# Patient Record
Sex: Male | Born: 1960
Health system: Southern US, Community
[De-identification: ages and names within clinical notes are randomized; demographics above are authoritative.]

## PROBLEM LIST (undated history)

## (undated) DIAGNOSIS — G43909 Migraine, unspecified, not intractable, without status migrainosus: Secondary | ICD-10-CM

## (undated) DIAGNOSIS — I10 Essential (primary) hypertension: Secondary | ICD-10-CM

## (undated) DIAGNOSIS — S83206A Unspecified tear of unspecified meniscus, current injury, right knee, initial encounter: Secondary | ICD-10-CM

## (undated) DIAGNOSIS — Z87438 Personal history of other diseases of male genital organs: Secondary | ICD-10-CM

## (undated) DIAGNOSIS — E669 Obesity, unspecified: Secondary | ICD-10-CM

## (undated) DIAGNOSIS — K635 Polyp of colon: Secondary | ICD-10-CM

## (undated) DIAGNOSIS — E785 Hyperlipidemia, unspecified: Secondary | ICD-10-CM

## (undated) DIAGNOSIS — J309 Allergic rhinitis, unspecified: Secondary | ICD-10-CM

## (undated) HISTORY — DX: Migraine, unspecified, not intractable, without status migrainosus: G43.909

## (undated) HISTORY — DX: Polyp of colon: K63.5

## (undated) HISTORY — DX: Personal history of other diseases of male genital organs: Z87.438

## (undated) HISTORY — DX: Hyperlipidemia, unspecified: E78.5

## (undated) HISTORY — DX: Unspecified tear of unspecified meniscus, current injury, right knee, initial encounter: S83.206A

## (undated) HISTORY — DX: Allergic rhinitis, unspecified: J30.9

## (undated) HISTORY — DX: Obesity, unspecified: E66.9

---

## 1999-02-19 ENCOUNTER — Emergency Department (HOSPITAL_COMMUNITY): Admission: EM | Admit: 1999-02-19 | Discharge: 1999-02-19 | Payer: Self-pay | Admitting: Emergency Medicine

## 1999-08-21 ENCOUNTER — Encounter: Payer: Self-pay | Admitting: *Deleted

## 1999-08-21 ENCOUNTER — Encounter: Admission: RE | Admit: 1999-08-21 | Discharge: 1999-08-21 | Payer: Self-pay | Admitting: *Deleted

## 2005-05-20 ENCOUNTER — Emergency Department (HOSPITAL_COMMUNITY): Admission: EM | Admit: 2005-05-20 | Discharge: 2005-05-20 | Payer: Self-pay | Admitting: Emergency Medicine

## 2006-01-11 ENCOUNTER — Encounter: Admission: RE | Admit: 2006-01-11 | Discharge: 2006-01-11 | Payer: Self-pay | Admitting: Neurology

## 2006-04-04 ENCOUNTER — Encounter: Admission: RE | Admit: 2006-04-04 | Discharge: 2006-04-04 | Payer: Self-pay | Admitting: Family Medicine

## 2011-12-01 ENCOUNTER — Other Ambulatory Visit (HOSPITAL_COMMUNITY)
Admission: RE | Admit: 2011-12-01 | Discharge: 2011-12-01 | Disposition: A | Discharge status: Home / Self Care | Payer: 59 | Source: Ambulatory Visit | Attending: Otolaryngology | Admitting: Otolaryngology

## 2011-12-01 DIAGNOSIS — R220 Localized swelling, mass and lump, head: Secondary | ICD-10-CM | POA: Insufficient documentation

## 2013-11-14 ENCOUNTER — Encounter: Payer: Self-pay | Admitting: *Deleted

## 2017-06-21 ENCOUNTER — Other Ambulatory Visit: Payer: Self-pay | Admitting: Otolaryngology

## 2017-06-21 DIAGNOSIS — R221 Localized swelling, mass and lump, neck: Principal | ICD-10-CM

## 2017-06-21 DIAGNOSIS — R220 Localized swelling, mass and lump, head: Secondary | ICD-10-CM

## 2017-07-05 ENCOUNTER — Ambulatory Visit
Admission: RE | Admit: 2017-07-05 | Discharge: 2017-07-05 | Disposition: A | Discharge status: Home / Self Care | Payer: 59 | Source: Ambulatory Visit | Attending: Otolaryngology | Admitting: Otolaryngology

## 2017-07-05 DIAGNOSIS — R221 Localized swelling, mass and lump, neck: Principal | ICD-10-CM

## 2017-07-05 DIAGNOSIS — R220 Localized swelling, mass and lump, head: Secondary | ICD-10-CM

## 2017-07-05 MED ORDER — IOPAMIDOL (ISOVUE-300) INJECTION 61%
75.0000 mL | Freq: Once | INTRAVENOUS | Status: AC | PRN
  Administered 2017-07-05: 75 mL via INTRAVENOUS

## 2017-12-09 ENCOUNTER — Other Ambulatory Visit (HOSPITAL_COMMUNITY): Payer: Self-pay | Admitting: Otolaryngology

## 2017-12-09 DIAGNOSIS — R221 Localized swelling, mass and lump, neck: Secondary | ICD-10-CM

## 2017-12-09 DIAGNOSIS — R220 Localized swelling, mass and lump, head: Secondary | ICD-10-CM

## 2017-12-15 ENCOUNTER — Ambulatory Visit (HOSPITAL_COMMUNITY)
Admission: RE | Admit: 2017-12-15 | Discharge: 2017-12-15 | Disposition: A | Discharge status: Home / Self Care | Payer: 59 | Source: Ambulatory Visit | Attending: Otolaryngology | Admitting: Otolaryngology

## 2017-12-15 ENCOUNTER — Encounter (HOSPITAL_COMMUNITY): Payer: Self-pay

## 2018-10-02 ENCOUNTER — Other Ambulatory Visit: Payer: Self-pay | Admitting: Internal Medicine

## 2018-10-02 ENCOUNTER — Ambulatory Visit
Admission: RE | Admit: 2018-10-02 | Discharge: 2018-10-02 | Disposition: A | Discharge status: Home / Self Care | Payer: 59 | Source: Ambulatory Visit | Attending: Internal Medicine | Admitting: Internal Medicine

## 2018-10-02 ENCOUNTER — Other Ambulatory Visit: Payer: Self-pay

## 2018-10-02 DIAGNOSIS — R0789 Other chest pain: Secondary | ICD-10-CM

## 2019-07-16 ENCOUNTER — Ambulatory Visit: Payer: 59 | Attending: Internal Medicine

## 2019-07-16 DIAGNOSIS — Z20822 Contact with and (suspected) exposure to covid-19: Secondary | ICD-10-CM

## 2019-07-17 LAB — NOVEL CORONAVIRUS, NAA: SARS-CoV-2, NAA: DETECTED — AB

## 2019-07-17 LAB — SARS-COV-2, NAA 2 DAY TAT

## 2019-07-19 ENCOUNTER — Other Ambulatory Visit (HOSPITAL_COMMUNITY): Payer: Self-pay | Admitting: Nurse Practitioner

## 2019-07-19 ENCOUNTER — Encounter: Payer: Self-pay | Admitting: Nurse Practitioner

## 2019-07-19 ENCOUNTER — Telehealth (HOSPITAL_COMMUNITY): Payer: Self-pay | Admitting: Nurse Practitioner

## 2019-07-19 DIAGNOSIS — U071 COVID-19: Secondary | ICD-10-CM

## 2019-07-19 NOTE — Progress Notes (Signed)
I connected by phone with Luis Cameron on 07/19/2019 at 1:12 PM to discuss the potential use of an new treatment for mild to moderate COVID-19 viral infection in non-hospitalized patients.  This patient is a 59 y.o. male that meets the FDA criteria for Emergency Use Authorization of bamlanivimab or casirivimab\imdevimab.  Has a (+) direct SARS-CoV-2 viral test result  Has mild or moderate COVID-19   Is ? 58 years of age and weighs ? 40 kg  Is NOT hospitalized due to COVID-19  Is NOT requiring oxygen therapy or requiring an increase in baseline oxygen flow rate due to COVID-19  Is within 10 days of symptom onset  Has at least one of the high risk factor(s) for progression to severe COVID-19 and/or hospitalization as defined in EUA.  Specific high risk criteria : Hypertension  I have spoken and communicated the following to the patient or parent/caregiver:  1. FDA has authorized the emergency use of bamlanivimab and casirivimab\imdevimab for the treatment of mild to moderate COVID-19 in adults and pediatric patients with positive results of direct SARS-CoV-2 viral testing who are 34 years of age and older weighing at least 40 kg, and who are at high risk for progressing to severe COVID-19 and/or hospitalization.  2. The significant known and potential risks and benefits of bamlanivimab and casirivimab\imdevimab, and the extent to which such potential risks and benefits are unknown.  3. Information on available alternative treatments and the risks and benefits of those alternatives, including clinical trials.  4. Patients treated with bamlanivimab and casirivimab\imdevimab should continue to self-isolate and use infection control measures (e.g., wear mask, isolate, social distance, avoid sharing personal items, clean and disinfect "high touch" surfaces, and frequent handwashing) according to CDC guidelines.   5. The patient or parent/caregiver has the option to accept or refuse  bamlanivimab or casirivimab\imdevimab .  After reviewing this information with the patient, The patient agreed to proceed with receiving the bamlanimivab infusion and will be provided a copy of the Fact sheet prior to receiving the infusion.Beckey Rutter, [place], AGNP-C [phone] ([place])

## 2019-07-19 NOTE — Telephone Encounter (Signed)
Called to Discuss with patient about Covid symptoms and the use of bamlanivimab, a monoclonal antibody infusion for those with mild to moderate Covid symptoms and at a high risk of hospitalization.     Pt is qualified for this infusion at the [facility] infusion center due to co-morbid conditions and/or a member of an at-risk group.     Symptoms started on 3/23. Patient diagnosed with hypertension and > 58 years of age. See orders encounter for details regarding MAB treatment.   Beckey Rutter, [place], AGNP-C [phone] ([place])

## 2019-07-20 ENCOUNTER — Ambulatory Visit (HOSPITAL_COMMUNITY)
Admission: RE | Admit: 2019-07-20 | Discharge: 2019-07-20 | Disposition: A | Discharge status: Home / Self Care | Payer: 59 | Source: Ambulatory Visit | Attending: Pulmonary Disease | Admitting: Pulmonary Disease

## 2019-07-20 DIAGNOSIS — U071 COVID-19: Secondary | ICD-10-CM | POA: Insufficient documentation

## 2019-07-20 MED ORDER — EPINEPHRINE 0.3 MG/0.3ML IJ SOAJ: 0.3000 mg | Freq: Once | INTRAMUSCULAR | Status: DC | PRN

## 2019-07-20 MED ORDER — SODIUM CHLORIDE 0.9 % IV SOLN
700.0000 mg | Freq: Once | INTRAVENOUS | Status: AC
  Administered 2019-07-20: 13:00:00 700 mg via INTRAVENOUS
  Filled 2019-07-20: qty 700

## 2019-07-20 MED ORDER — DIPHENHYDRAMINE HCL 50 MG/ML IJ SOLN: 50.0000 mg | Freq: Once | INTRAMUSCULAR | Status: DC | PRN

## 2019-07-20 MED ORDER — ALBUTEROL SULFATE HFA 108 (90 BASE) MCG/ACT IN AERS: 2.0000 | INHALATION_SPRAY | Freq: Once | RESPIRATORY_TRACT | Status: DC | PRN

## 2019-07-20 MED ORDER — SODIUM CHLORIDE 0.9 % IV SOLN: INTRAVENOUS | Status: DC | PRN

## 2019-07-20 MED ORDER — METHYLPREDNISOLONE SODIUM SUCC 125 MG IJ SOLR: 125.0000 mg | Freq: Once | INTRAMUSCULAR | Status: DC | PRN

## 2019-07-20 MED ORDER — FAMOTIDINE IN NACL 20-0.9 MG/50ML-% IV SOLN: 20.0000 mg | Freq: Once | INTRAVENOUS | Status: DC | PRN

## 2019-07-20 NOTE — Progress Notes (Signed)
  Diagnosis: COVID-19  Physician:dr wright   Procedure: [facility] Med: bamlanivimab infusion - Provided patient with bamlanimivab fact sheet for patients, parents and caregivers prior to infusion.  Complications: No immediate complications noted.  Discharge: Discharged home   Woodrow Drab R 07/20/2019   

## 2019-07-20 NOTE — Discharge Instructions (Signed)
COVID-19 COVID-19 is a respiratory infection that is caused by a virus called severe acute respiratory syndrome coronavirus 2 (SARS-CoV-2). The disease is also known as coronavirus disease or novel coronavirus. In some people, the virus may not cause any symptoms. In others, it may cause a serious infection. The infection can get worse quickly and can lead to complications, such as:  Pneumonia, or infection of the lungs.  Acute respiratory distress syndrome or ARDS. This is a condition in which fluid build-up in the lungs prevents the lungs from filling with air and passing oxygen into the blood.  Acute respiratory failure. This is a condition in which there is not enough oxygen passing from the lungs to the body or when carbon dioxide is not passing from the lungs out of the body.  Sepsis or septic shock. This is a serious bodily reaction to an infection.  Blood clotting problems.  Secondary infections due to bacteria or fungus.  Organ failure. This is when your body's organs stop working. The virus that causes COVID-19 is contagious. This means that it can spread from person to person through droplets from coughs and sneezes (respiratory secretions). What are the causes? This illness is caused by a virus. You may catch the virus by:  Breathing in droplets from an infected person. Droplets can be spread by a person breathing, speaking, singing, coughing, or sneezing.  Touching something, like a table or a doorknob, that was exposed to the virus (contaminated) and then touching your mouth, nose, or eyes. What increases the risk? Risk for infection You are more likely to be infected with this virus if you:  Are within 6 feet (2 meters) of a person with COVID-19.  Provide care for or live with a person who is infected with COVID-19.  Spend time in crowded indoor spaces or live in shared housing. Risk for serious illness You are more likely to become seriously ill from the virus if  you:  Are 50 years of age or older. The higher your age, the more you are at risk for serious illness.  Live in a nursing home or long-term care facility.  Have cancer.  Have a long-term (chronic) disease such as: ? Chronic lung disease, including chronic obstructive pulmonary disease or asthma. ? A long-term disease that lowers your body's ability to fight infection (immunocompromised). ? Heart disease, including heart failure, a condition in which the arteries that lead to the heart become narrow or blocked (coronary artery disease), a disease which makes the heart muscle thick, weak, or stiff (cardiomyopathy). ? Diabetes. ? Chronic kidney disease. ? Sickle cell disease, a condition in which red blood cells have an abnormal "sickle" shape. ? Liver disease.  Are obese. What are the signs or symptoms? Symptoms of this condition can range from mild to severe. Symptoms may appear any time from 2 to 14 days after being exposed to the virus. They include:  A fever or chills.  A cough.  Difficulty breathing.  Headaches, body aches, or muscle aches.  Runny or stuffy (congested) nose.  A sore throat.  New loss of taste or smell. Some people may also have stomach problems, such as nausea, vomiting, or diarrhea. Other people may not have any symptoms of COVID-19. How is this diagnosed? This condition may be diagnosed based on:  Your signs and symptoms, especially if: ? You live in an area with a COVID-19 outbreak. ? You recently traveled to or from an area where the virus is common. ? You   provide care for or live with a person who was diagnosed with COVID-19. ? You were exposed to a person who was diagnosed with COVID-19.  A physical exam.  Lab tests, which may include: ? Taking a sample of fluid from the back of your nose and throat (nasopharyngeal fluid), your nose, or your throat using a swab. ? A sample of mucus from your lungs (sputum). ? Blood tests.  Imaging tests,  which may include, X-rays, CT scan, or ultrasound. How is this treated? At present, there is no medicine to treat COVID-19. Medicines that treat other diseases are being used on a trial basis to see if they are effective against COVID-19. Your health care provider will talk with you about ways to treat your symptoms. For most people, the infection is mild and can be managed at home with rest, fluids, and over-the-counter medicines. Treatment for a serious infection usually takes places in a hospital intensive care unit (ICU). It may include one or more of the following treatments. These treatments are given until your symptoms improve.  Receiving fluids and medicines through an IV.  Supplemental oxygen. Extra oxygen is given through a tube in the nose, a face mask, or a hood.  Positioning you to lie on your stomach (prone position). This makes it easier for oxygen to get into the lungs.  Continuous positive airway pressure (CPAP) or bi-level positive airway pressure (BPAP) machine. This treatment uses mild air pressure to keep the airways open. A tube that is connected to a motor delivers oxygen to the body.  Ventilator. This treatment moves air into and out of the lungs by using a tube that is placed in your windpipe.  Tracheostomy. This is a procedure to create a hole in the neck so that a breathing tube can be inserted.  Extracorporeal membrane oxygenation (ECMO). This procedure gives the lungs a chance to recover by taking over the functions of the heart and lungs. It supplies oxygen to the body and removes carbon dioxide. Follow these instructions at home: Lifestyle  If you are sick, stay home except to get medical care. Your health care provider will tell you how long to stay home. Call your health care provider before you go for medical care.  Rest at home as told by your health care provider.  Do not use any products that contain nicotine or tobacco, such as cigarettes,  e-cigarettes, and chewing tobacco. If you need help quitting, ask your health care provider.  Return to your normal activities as told by your health care provider. Ask your health care provider what activities are safe for you. General instructions  Take over-the-counter and prescription medicines only as told by your health care provider.  Drink enough fluid to keep your urine pale yellow.  Keep all follow-up visits as told by your health care provider. This is important. How is this prevented?  There is no vaccine to help prevent COVID-19 infection. However, there are steps you can take to protect yourself and others from this virus. To protect yourself:   Do not travel to areas where COVID-19 is a risk. The areas where COVID-19 is reported change often. To identify high-risk areas and travel restrictions, check the CDC travel website: wwwnc.cdc.gov/travel/notices  If you live in, or must travel to, an area where COVID-19 is a risk, take precautions to avoid infection. ? Stay away from people who are sick. ? Wash your hands often with soap and water for 20 seconds. If soap and water   are not available, use an alcohol-based hand sanitizer. ? Avoid touching your mouth, face, eyes, or nose. ? Avoid going out in public, follow guidance from your state and local health authorities. ? If you must go out in public, wear a cloth face covering or face mask. Make sure your mask covers your nose and mouth. ? Avoid crowded indoor spaces. Stay at least 6 feet (2 meters) away from others. ? Disinfect objects and surfaces that are frequently touched every day. This may include:  Counters and tables.  Doorknobs and light switches.  Sinks and faucets.  Electronics, such as phones, remote controls, keyboards, computers, and tablets. To protect others: If you have symptoms of COVID-19, take steps to prevent the virus from spreading to others.  If you think you have a COVID-19 infection, contact  your health care provider right away. Tell your health care team that you think you may have a COVID-19 infection.  Stay home. Leave your house only to seek medical care. Do not use public transport.  Do not travel while you are sick.  Wash your hands often with soap and water for 20 seconds. If soap and water are not available, use alcohol-based hand sanitizer.  Stay away from other members of your household. Let healthy household members care for children and pets, if possible. If you have to care for children or pets, wash your hands often and wear a mask. If possible, stay in your own room, separate from others. Use a different bathroom.  Make sure that all people in your household wash their hands well and often.  Cough or sneeze into a tissue or your sleeve or elbow. Do not cough or sneeze into your hand or into the air.  Wear a cloth face covering or face mask. Make sure your mask covers your nose and mouth. Where to find more information  Centers for Disease Control and Prevention: www.cdc.gov/coronavirus/2019-ncov/index.html  World Health Organization: www.who.int/health-topics/coronavirus Contact a health care provider if:  You live in or have traveled to an area where COVID-19 is a risk and you have symptoms of the infection.  You have had contact with someone who has COVID-19 and you have symptoms of the infection. Get help right away if:  You have trouble breathing.  You have pain or pressure in your chest.  You have confusion.  You have bluish lips and fingernails.  You have difficulty waking from sleep.  You have symptoms that get worse. These symptoms may represent a serious problem that is an emergency. Do not wait to see if the symptoms will go away. Get medical help right away. Call your local emergency services (911 in the [place] Do not drive yourself to the hospital. Let the emergency medical personnel know if you think you have  COVID-19. Summary  COVID-19 is a respiratory infection that is caused by a virus. It is also known as coronavirus disease or novel coronavirus. It can cause serious infections, such as pneumonia, acute respiratory distress syndrome, acute respiratory failure, or sepsis.  The virus that causes COVID-19 is contagious. This means that it can spread from person to person through droplets from breathing, speaking, singing, coughing, or sneezing.  You are more likely to develop a serious illness if you are 50 years of age or older, have a weak immune system, live in a nursing home, or have chronic disease.  There is no medicine to treat COVID-19. Your health care provider will talk with you about ways to treat your symptoms.    Take steps to protect yourself and others from infection. Wash your hands often and disinfect objects and surfaces that are frequently touched every day. Stay away from people who are sick and wear a mask if you are sick. This information is not intended to replace advice given to you by your health care provider. Make sure you discuss any questions you have with your health care provider. Document Revised: 02/09/2019 Document Reviewed: 05/18/2018 Elsevier Patient Education  2020 [employer] Inc. What types of side effects do monoclonal antibody drugs cause?  Common side effects  In general, the more common side effects caused by monoclonal antibody drugs include: . Allergic reactions, such as hives or itching . Flu-like signs and symptoms, including chills, fatigue, fever, and muscle aches and pains . Nausea, vomiting . Diarrhea . Skin rashes . Low blood pressure   The CDC is recommending patients who receive monoclonal antibody treatments wait at least 90 days before being vaccinated.  Currently, there are no data on the safety and efficacy of mRNA COVID-19 vaccines in persons who received monoclonal antibodies or convalescent plasma as part of COVID-19 treatment. Based  on the estimated half-life of such therapies as well as evidence suggesting that reinfection is uncommon in the 90 days after initial infection, vaccination should be deferred for at least 90 days, as a precautionary measure until additional information becomes available, to avoid interference of the antibody treatment with vaccine-induced immune responses. 

## 2020-02-28 ENCOUNTER — Other Ambulatory Visit: Payer: Self-pay | Admitting: Otolaryngology

## 2020-02-28 DIAGNOSIS — D110 Benign neoplasm of parotid gland: Secondary | ICD-10-CM

## 2020-03-03 ENCOUNTER — Other Ambulatory Visit: Payer: Self-pay

## 2020-03-03 ENCOUNTER — Ambulatory Visit
Admission: RE | Admit: 2020-03-03 | Discharge: 2020-03-03 | Disposition: A | Discharge status: Home / Self Care | Payer: 59 | Source: Ambulatory Visit | Attending: Otolaryngology | Admitting: Otolaryngology

## 2020-03-03 DIAGNOSIS — D110 Benign neoplasm of parotid gland: Secondary | ICD-10-CM

## 2020-03-03 MED ORDER — IOPAMIDOL (ISOVUE-300) INJECTION 61%
75.0000 mL | Freq: Once | INTRAVENOUS | Status: AC | PRN
  Administered 2020-03-03: 75 mL via INTRAVENOUS

## 2020-12-22 ENCOUNTER — Other Ambulatory Visit: Payer: Self-pay | Admitting: Internal Medicine

## 2020-12-22 DIAGNOSIS — E785 Hyperlipidemia, unspecified: Secondary | ICD-10-CM

## 2021-01-13 ENCOUNTER — Ambulatory Visit
Admission: RE | Admit: 2021-01-13 | Discharge: 2021-01-13 | Disposition: A | Discharge status: Home / Self Care | Payer: No Typology Code available for payment source | Source: Ambulatory Visit | Attending: Internal Medicine | Admitting: Internal Medicine

## 2021-01-13 DIAGNOSIS — E785 Hyperlipidemia, unspecified: Secondary | ICD-10-CM

## 2021-07-16 DIAGNOSIS — H00022 Hordeolum internum right lower eyelid: Secondary | ICD-10-CM | POA: Diagnosis not present

## 2021-12-24 DIAGNOSIS — Z0001 Encounter for general adult medical examination with abnormal findings: Secondary | ICD-10-CM | POA: Diagnosis not present

## 2021-12-24 DIAGNOSIS — K219 Gastro-esophageal reflux disease without esophagitis: Secondary | ICD-10-CM | POA: Diagnosis not present

## 2021-12-24 DIAGNOSIS — I10 Essential (primary) hypertension: Secondary | ICD-10-CM | POA: Diagnosis not present

## 2021-12-24 DIAGNOSIS — R3129 Other microscopic hematuria: Secondary | ICD-10-CM | POA: Diagnosis not present

## 2021-12-24 DIAGNOSIS — E785 Hyperlipidemia, unspecified: Secondary | ICD-10-CM | POA: Diagnosis not present

## 2021-12-25 DIAGNOSIS — Z125 Encounter for screening for malignant neoplasm of prostate: Secondary | ICD-10-CM | POA: Diagnosis not present

## 2021-12-25 DIAGNOSIS — I10 Essential (primary) hypertension: Secondary | ICD-10-CM | POA: Diagnosis not present

## 2021-12-25 DIAGNOSIS — E559 Vitamin D deficiency, unspecified: Secondary | ICD-10-CM | POA: Diagnosis not present

## 2021-12-25 DIAGNOSIS — E785 Hyperlipidemia, unspecified: Secondary | ICD-10-CM | POA: Diagnosis not present

## 2022-04-05 IMAGING — CT CT CARDIAC CORONARY ARTERY CALCIUM SCORE
3 series · 14 of 20 positions shown, 16 images · non-contrast
Comparison: None.

CLINICAL DATA: Hyperlipidemia

EXAM:
CT CARDIAC CORONARY ARTERY CALCIUM SCORE
TECHNIQUE: Non-contrast imaging through the heart was performed using
prospective ECG gating. Image post processing was performed on an
independent workstation, allowing for quantitative analysis of the
heart and coronary arteries. Note that this exam targets the heart
and the chest was not imaged in its entirety.

[Series 2: calcium scoring 2.00 qr36 bestdiast 71% hrt calciu · axial · 0.45mm/px · z∈[+1702,+1786]mm · 4 of 70 slices shown]
[im 14/70  vessel]
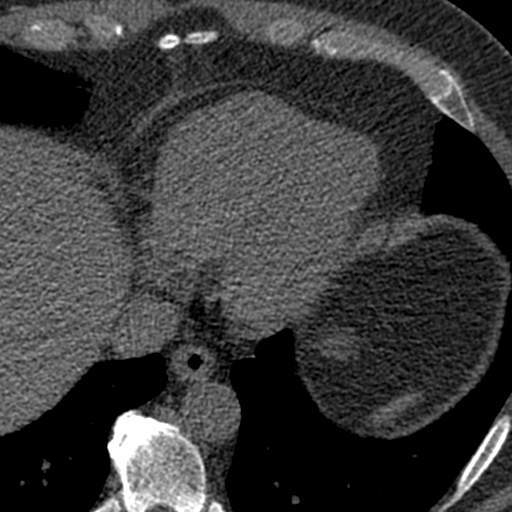
[im 28/70  vessel]
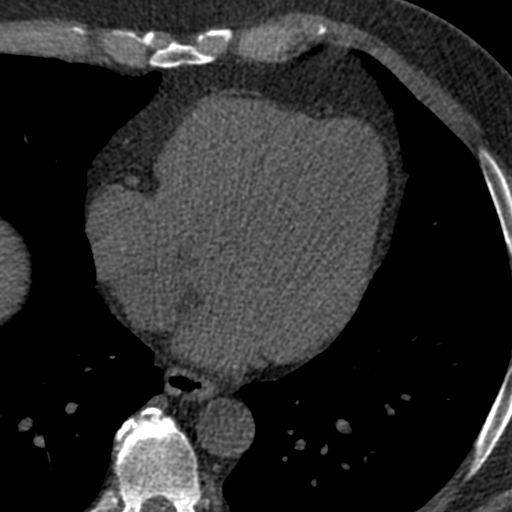
[im 42/70  vessel]
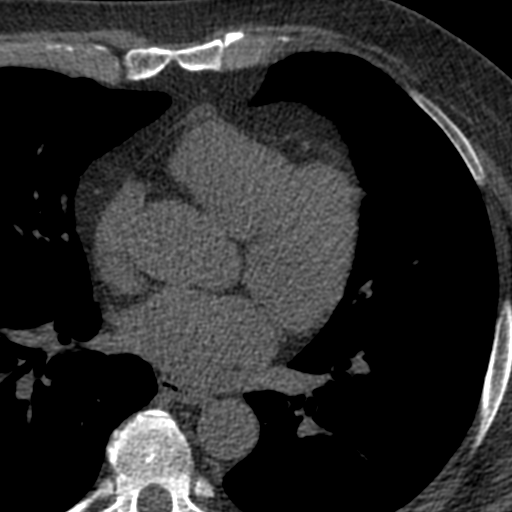
[im 56/70  vessel]
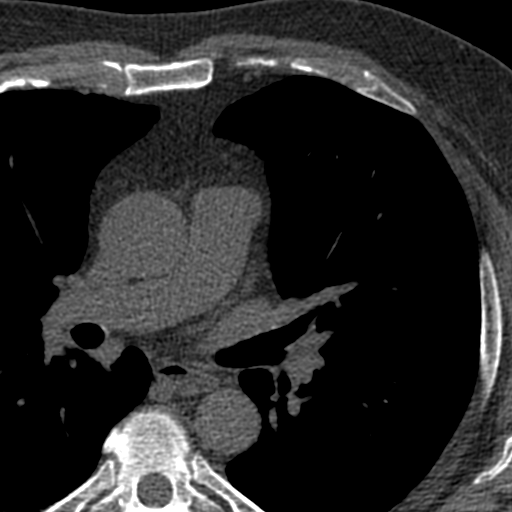

[Series 3: calcium scoring 2.00 br40 bestdiast 71% axial · axial · 0.63mm/px · z∈[+1698,+1790]mm · 5 of 70 slices shown, 7 images]
[im 12/70  vessel]
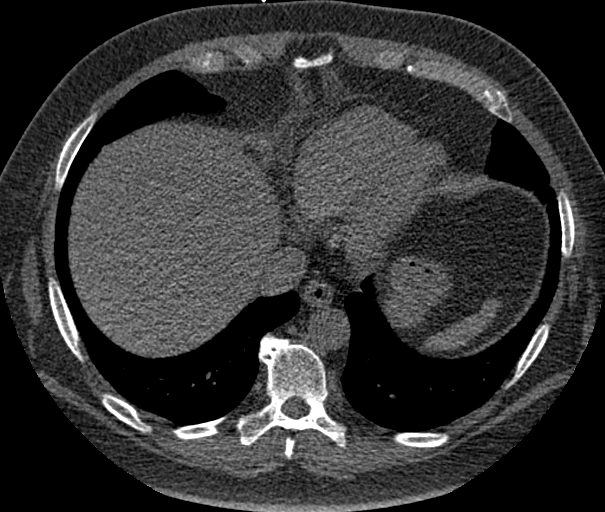
[im 12/70  lung]
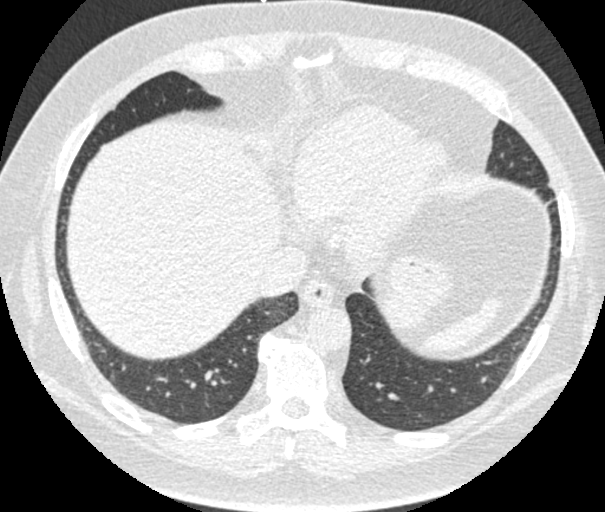
[im 24/70  vessel]
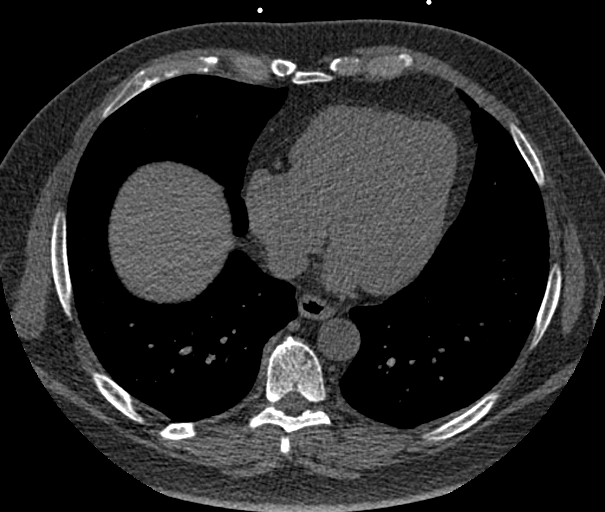
[im 35/70  vessel]
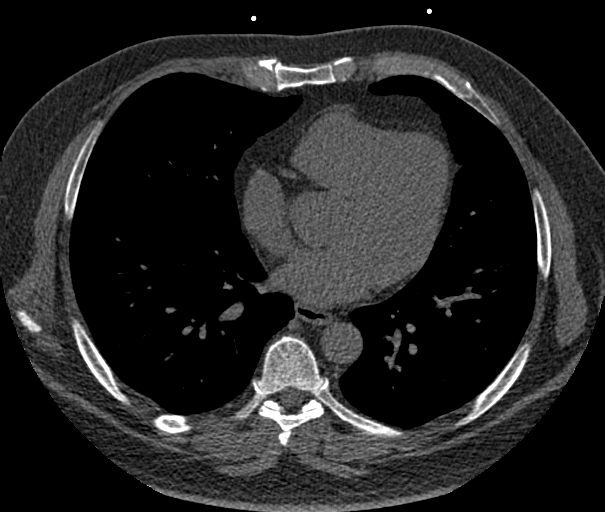
[im 47/70  vessel]
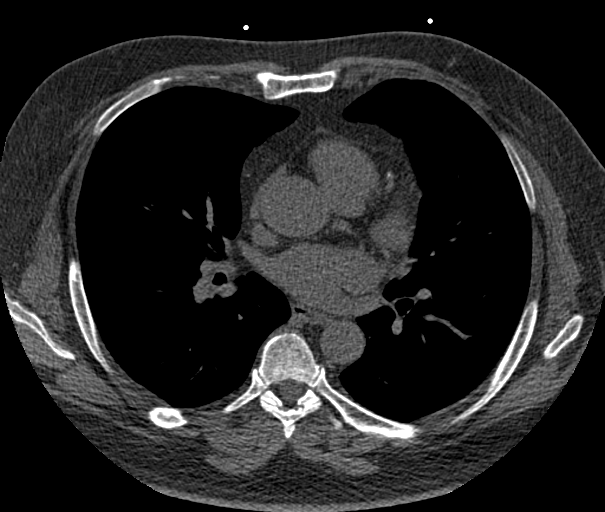
[im 58/70  vessel]
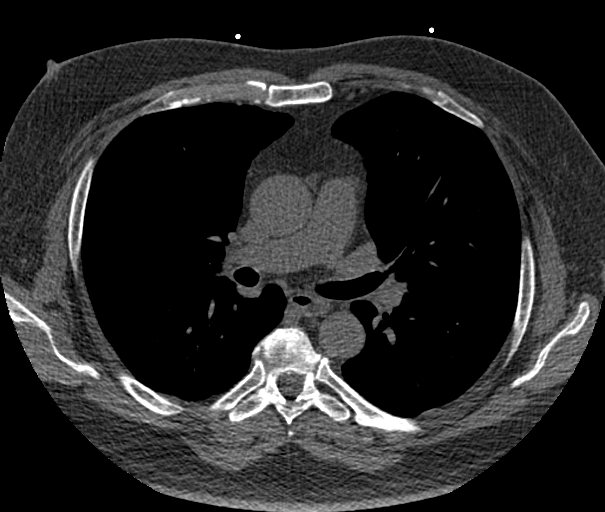
[im 58/70  lung]
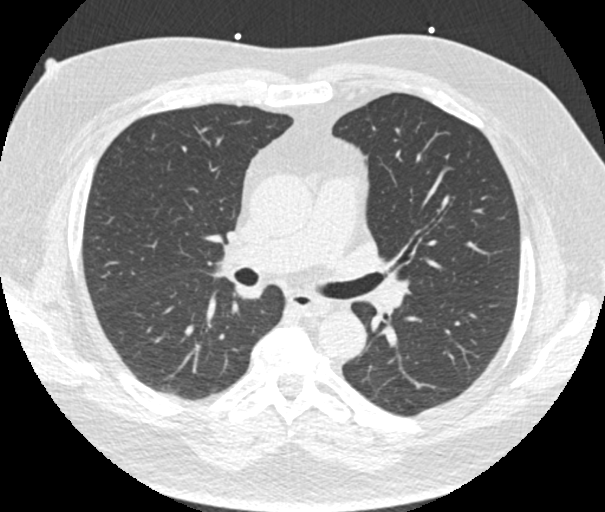

[Series 9: calcium scoring 2.00 br60 bestdiast 71% lungs · axial · 0.66mm/px · z∈[+1698,+1790]mm · 5 of 70 slices shown]
[im 12/70  vessel]
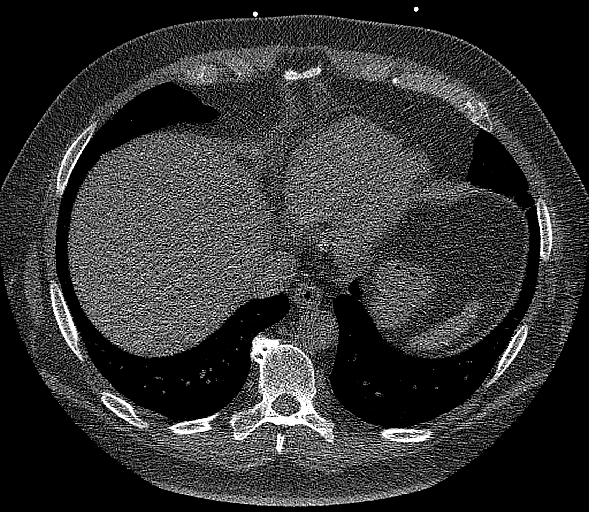
[im 24/70  vessel]
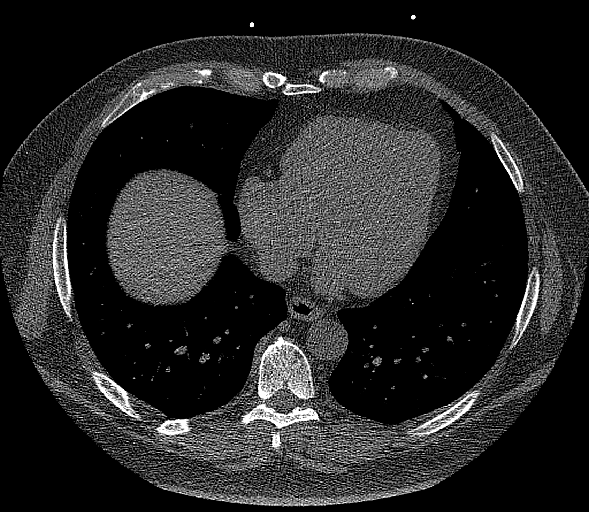
[im 35/70  vessel]
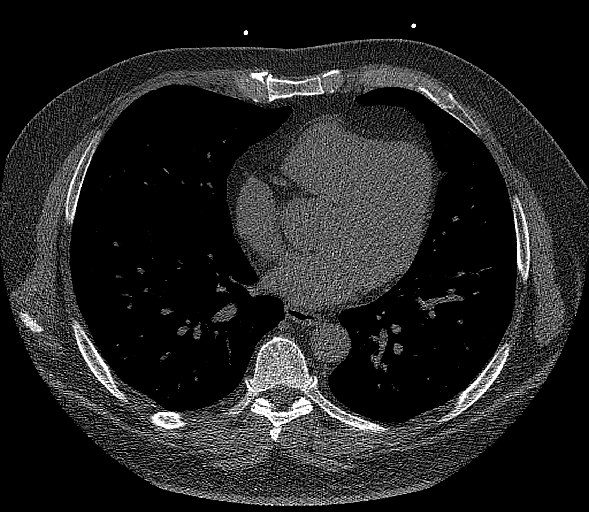
[im 47/70  vessel]
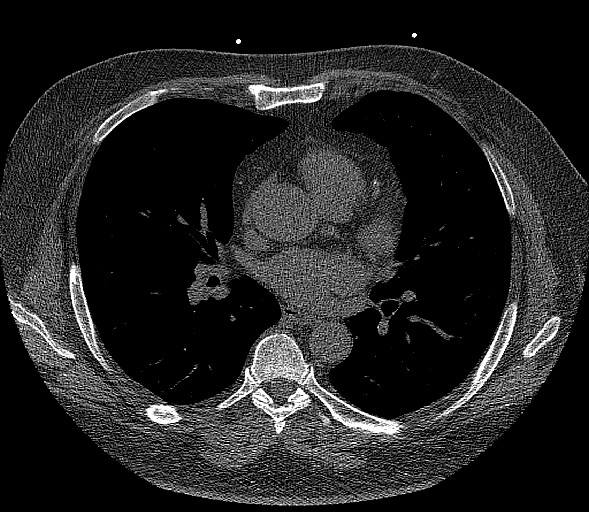
[im 58/70  vessel]
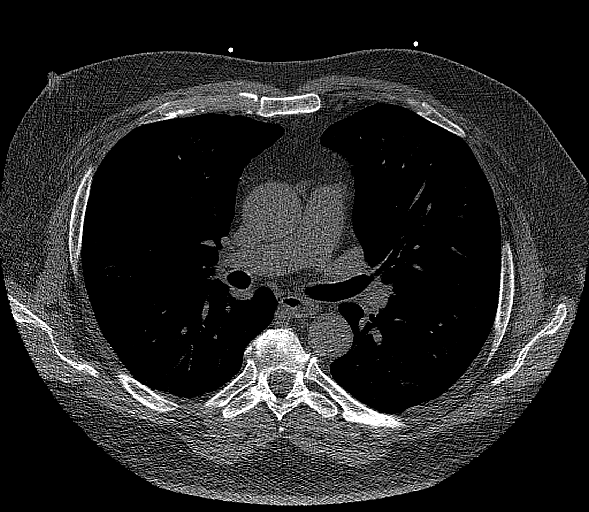

[14 of 20 positions shown; findings below may reference images not displayed]

FINDINGS: CORONARY CALCIUM SCORES:

Left Main: 0

LAD: 39

LCx: 0

RCA: 20

Total Agatston Score: 59

[HOSPITAL] percentile: 62

AORTA MEASUREMENTS:

Ascending Aorta: 35 mm

Descending Aorta: 27 mm

OTHER FINDINGS:

Heart is normal size. Aorta normal caliber. Scattered calcifications
in the aortic arch. No adenopathy. No confluent opacity or effusion.
Imaging into the upper abdomen demonstrates no acute findings. Chest
wall soft tissues are unremarkable. No acute bony abnormality.
IMPRESSION: Total Agatston score: 59

[HOSPITAL] percentile: 62

Aortic atherosclerosis.

No acute extra cardiac abnormality.

## 2022-05-27 DIAGNOSIS — D485 Neoplasm of uncertain behavior of skin: Secondary | ICD-10-CM | POA: Diagnosis not present

## 2022-07-15 DIAGNOSIS — D110 Benign neoplasm of parotid gland: Secondary | ICD-10-CM | POA: Diagnosis not present

## 2022-09-23 DIAGNOSIS — L738 Other specified follicular disorders: Secondary | ICD-10-CM | POA: Diagnosis not present

## 2022-09-23 DIAGNOSIS — D224 Melanocytic nevi of scalp and neck: Secondary | ICD-10-CM | POA: Diagnosis not present

## 2022-09-23 DIAGNOSIS — D225 Melanocytic nevi of trunk: Secondary | ICD-10-CM | POA: Diagnosis not present

## 2022-12-10 DIAGNOSIS — H1013 Acute atopic conjunctivitis, bilateral: Secondary | ICD-10-CM | POA: Diagnosis not present

## 2023-01-13 DIAGNOSIS — Z23 Encounter for immunization: Secondary | ICD-10-CM | POA: Diagnosis not present

## 2023-01-13 DIAGNOSIS — E7800 Pure hypercholesterolemia, unspecified: Secondary | ICD-10-CM | POA: Diagnosis not present

## 2023-01-13 DIAGNOSIS — I10 Essential (primary) hypertension: Secondary | ICD-10-CM | POA: Diagnosis not present

## 2023-01-13 DIAGNOSIS — R739 Hyperglycemia, unspecified: Secondary | ICD-10-CM | POA: Diagnosis not present

## 2023-01-13 DIAGNOSIS — Z0000 Encounter for general adult medical examination without abnormal findings: Secondary | ICD-10-CM | POA: Diagnosis not present

## 2023-01-13 DIAGNOSIS — Z125 Encounter for screening for malignant neoplasm of prostate: Secondary | ICD-10-CM | POA: Diagnosis not present

## 2023-01-28 DIAGNOSIS — I10 Essential (primary) hypertension: Secondary | ICD-10-CM | POA: Diagnosis not present

## 2023-01-28 DIAGNOSIS — R7309 Other abnormal glucose: Secondary | ICD-10-CM | POA: Diagnosis not present

## 2023-10-21 DIAGNOSIS — N201 Calculus of ureter: Secondary | ICD-10-CM | POA: Diagnosis not present

## 2023-10-21 DIAGNOSIS — R3911 Hesitancy of micturition: Secondary | ICD-10-CM | POA: Diagnosis not present

## 2024-04-14 ENCOUNTER — Encounter (HOSPITAL_COMMUNITY): Payer: Self-pay | Admitting: Internal Medicine

## 2024-04-14 ENCOUNTER — Inpatient Hospital Stay (HOSPITAL_COMMUNITY)
Admission: EM | Admit: 2024-04-14 | Discharge: 2024-04-17 | DRG: 440 | Disposition: A | Discharge status: Home / Self Care | Source: Ambulatory Visit | Attending: Internal Medicine | Admitting: Internal Medicine

## 2024-04-14 ENCOUNTER — Emergency Department (HOSPITAL_COMMUNITY)

## 2024-04-14 ENCOUNTER — Other Ambulatory Visit: Payer: Self-pay

## 2024-04-14 DIAGNOSIS — Z823 Family history of stroke: Secondary | ICD-10-CM | POA: Diagnosis not present

## 2024-04-14 DIAGNOSIS — Z6833 Body mass index (BMI) 33.0-33.9, adult: Secondary | ICD-10-CM | POA: Diagnosis not present

## 2024-04-14 DIAGNOSIS — Z7982 Long term (current) use of aspirin: Secondary | ICD-10-CM

## 2024-04-14 DIAGNOSIS — E785 Hyperlipidemia, unspecified: Secondary | ICD-10-CM | POA: Diagnosis present

## 2024-04-14 DIAGNOSIS — E669 Obesity, unspecified: Secondary | ICD-10-CM | POA: Diagnosis present

## 2024-04-14 DIAGNOSIS — Z8249 Family history of ischemic heart disease and other diseases of the circulatory system: Secondary | ICD-10-CM | POA: Diagnosis not present

## 2024-04-14 DIAGNOSIS — K859 Acute pancreatitis without necrosis or infection, unspecified: Secondary | ICD-10-CM | POA: Diagnosis present

## 2024-04-14 DIAGNOSIS — Z91018 Allergy to other foods: Secondary | ICD-10-CM

## 2024-04-14 DIAGNOSIS — F1090 Alcohol use, unspecified, uncomplicated: Secondary | ICD-10-CM | POA: Diagnosis present

## 2024-04-14 DIAGNOSIS — Z860100 Personal history of colon polyps, unspecified: Secondary | ICD-10-CM | POA: Diagnosis not present

## 2024-04-14 DIAGNOSIS — K852 Alcohol induced acute pancreatitis without necrosis or infection: Secondary | ICD-10-CM

## 2024-04-14 DIAGNOSIS — Z888 Allergy status to other drugs, medicaments and biological substances status: Secondary | ICD-10-CM | POA: Diagnosis not present

## 2024-04-14 DIAGNOSIS — Z79899 Other long term (current) drug therapy: Secondary | ICD-10-CM

## 2024-04-14 DIAGNOSIS — I10 Essential (primary) hypertension: Secondary | ICD-10-CM | POA: Diagnosis present

## 2024-04-14 HISTORY — DX: Essential (primary) hypertension: I10

## 2024-04-14 LAB — COMPREHENSIVE METABOLIC PANEL WITH GFR
ALT: 22 U/L (ref 0–44)
AST: 24 U/L (ref 15–41)
Albumin: 4.2 g/dL (ref 3.5–5.0)
Alkaline Phosphatase: 96 U/L (ref 38–126)
Anion gap: 9 (ref 5–15)
BUN: 21 mg/dL (ref 8–23)
CO2: 26 mmol/L (ref 22–32)
Calcium: 9.9 mg/dL (ref 8.9–10.3)
Chloride: 104 mmol/L (ref 98–111)
Creatinine, Ser: 0.83 mg/dL (ref 0.61–1.24)
GFR, Estimated: >60 mL/min
Glucose, Bld: 124 mg/dL — ABNORMAL HIGH (ref 70–99)
Potassium: 3.6 mmol/L (ref 3.5–5.1)
Sodium: 140 mmol/L (ref 135–145)
Total Bilirubin: 1 mg/dL (ref 0.0–1.2)
Total Protein: 7.3 g/dL (ref 6.5–8.1)

## 2024-04-14 LAB — CBC
HCT: 52 % (ref 39.0–52.0)
HCT: 47.6 % (ref 39.0–52.0)
Hemoglobin: 18.1 g/dL — ABNORMAL HIGH (ref 13.0–17.0)
Hemoglobin: 16.6 g/dL (ref 13.0–17.0)
MCH: 32.3 pg (ref 26.0–34.0)
MCH: 32.1 pg (ref 26.0–34.0)
MCHC: 34.8 g/dL (ref 30.0–36.0)
MCHC: 34.9 g/dL (ref 30.0–36.0)
MCV: 92.9 fL (ref 80.0–100.0)
MCV: 92.1 fL (ref 80.0–100.0)
Platelets: 264 K/uL (ref 150–400)
Platelets: 249 K/uL (ref 150–400)
RBC: 5.6 MIL/uL (ref 4.22–5.81)
RBC: 5.17 MIL/uL (ref 4.22–5.81)
RDW: 12.9 % (ref 11.5–15.5)
RDW: 12.9 % (ref 11.5–15.5)
WBC: 14 K/uL — ABNORMAL HIGH (ref 4.0–10.5)
WBC: 16.1 K/uL — ABNORMAL HIGH (ref 4.0–10.5)
nRBC: 0 % (ref 0.0–0.2)
nRBC: 0 % (ref 0.0–0.2)

## 2024-04-14 LAB — URINALYSIS, ROUTINE W REFLEX MICROSCOPIC
Bacteria, UA: NONE SEEN
Bilirubin Urine: NEGATIVE
Glucose, UA: NEGATIVE mg/dL
Ketones, ur: 5 mg/dL — AB
Leukocytes,Ua: NEGATIVE
Nitrite: NEGATIVE
Protein, ur: NEGATIVE mg/dL
Specific Gravity, Urine: >1.046 — ABNORMAL HIGH (ref 1.005–1.030)
pH: 5 (ref 5.0–8.0)

## 2024-04-14 LAB — LIPASE, BLOOD: Lipase: >2800 U/L — ABNORMAL HIGH (ref 11–51)

## 2024-04-14 LAB — TRIGLYCERIDES: Triglycerides: 49 mg/dL

## 2024-04-14 LAB — CREATININE, SERUM
Creatinine, Ser: 0.63 mg/dL (ref 0.61–1.24)
GFR, Estimated: >60 mL/min

## 2024-04-14 LAB — LACTIC ACID, PLASMA: Lactic Acid, Venous: 1.8 mmol/L (ref 0.5–1.9)

## 2024-04-14 LAB — ETHANOL: Alcohol, Ethyl (B): <15 mg/dL

## 2024-04-14 LAB — I-STAT CG4 LACTIC ACID, ED: Lactic Acid, Venous: 0.8 mmol/L (ref 0.5–1.9)

## 2024-04-14 MED ORDER — OXYCODONE HCL 5 MG PO TABS
5.0000 mg | ORAL_TABLET | ORAL | Status: DC | PRN
  Administered 2024-04-14 – 2024-04-17 (×6): 5 mg via ORAL
  Filled 2024-04-14 (×6): qty 1

## 2024-04-14 MED ORDER — ACETAMINOPHEN 325 MG PO TABS
650.0000 mg | ORAL_TABLET | Freq: Four times a day (QID) | ORAL | Status: DC | PRN
  Administered 2024-04-15 – 2024-04-17 (×7): 650 mg via ORAL
  Filled 2024-04-14 (×7): qty 2

## 2024-04-14 MED ORDER — SODIUM CHLORIDE 0.9 % IV SOLN: Freq: Once | INTRAVENOUS | Status: AC

## 2024-04-14 MED ORDER — ONDANSETRON HCL 4 MG PO TABS: 4.0000 mg | ORAL_TABLET | Freq: Four times a day (QID) | ORAL | Status: DC | PRN

## 2024-04-14 MED ORDER — SODIUM CHLORIDE 0.9 % IV BOLUS
1000.0000 mL | Freq: Once | INTRAVENOUS | Status: AC
  Administered 2024-04-14: 1000 mL via INTRAVENOUS

## 2024-04-14 MED ORDER — ONDANSETRON HCL 4 MG/2ML IJ SOLN
4.0000 mg | Freq: Once | INTRAMUSCULAR | Status: AC
  Administered 2024-04-14: 4 mg via INTRAVENOUS
  Filled 2024-04-14: qty 2

## 2024-04-14 MED ORDER — PNEUMOCOCCAL 20-VAL CONJ VACC 0.5 ML IM SUSY
0.5000 mL | PREFILLED_SYRINGE | INTRAMUSCULAR | Status: DC
  Filled 2024-04-14: qty 0.5

## 2024-04-14 MED ORDER — ACETAMINOPHEN 650 MG RE SUPP: 650.0000 mg | Freq: Four times a day (QID) | RECTAL | Status: DC | PRN

## 2024-04-14 MED ORDER — ONDANSETRON HCL 4 MG/2ML IJ SOLN
4.0000 mg | Freq: Four times a day (QID) | INTRAMUSCULAR | Status: DC | PRN
  Administered 2024-04-15 (×3): 4 mg via INTRAVENOUS
  Filled 2024-04-14 (×3): qty 2

## 2024-04-14 MED ORDER — ENOXAPARIN SODIUM 40 MG/0.4ML IJ SOSY
40.0000 mg | PREFILLED_SYRINGE | INTRAMUSCULAR | Status: DC
  Administered 2024-04-14 – 2024-04-16 (×3): 40 mg via SUBCUTANEOUS
  Filled 2024-04-14 (×3): qty 0.4

## 2024-04-14 MED ORDER — HYDRALAZINE HCL 20 MG/ML IJ SOLN: 10.0000 mg | Freq: Four times a day (QID) | INTRAMUSCULAR | Status: DC | PRN

## 2024-04-14 MED ORDER — IOHEXOL 300 MG/ML  SOLN
100.0000 mL | Freq: Once | INTRAMUSCULAR | Status: AC | PRN
  Administered 2024-04-14: 100 mL via INTRAVENOUS

## 2024-04-14 MED ORDER — HYDROMORPHONE HCL 1 MG/ML IJ SOLN
1.0000 mg | Freq: Once | INTRAMUSCULAR | Status: AC
  Administered 2024-04-14: 1 mg via INTRAVENOUS
  Filled 2024-04-14: qty 1

## 2024-04-14 MED ORDER — MORPHINE SULFATE (PF) 4 MG/ML IV SOLN
4.0000 mg | Freq: Once | INTRAVENOUS | Status: AC
  Administered 2024-04-14: 4 mg via INTRAVENOUS
  Filled 2024-04-14: qty 1

## 2024-04-14 MED ORDER — SODIUM CHLORIDE 0.9 % IV SOLN: INTRAVENOUS | Status: AC

## 2024-04-14 MED ORDER — HYDROMORPHONE HCL 1 MG/ML IJ SOLN: 0.5000 mg | INTRAMUSCULAR | Status: DC | PRN

## 2024-04-14 NOTE — ED Provider Notes (Signed)
 " [place] EMERGENCY DEPARTMENT AT [facility] Provider Note   CSN: [id number] Arrival date & time: 04/14/24  1316     Patient presents with: Abdominal Pain   Luis Cameron is a 63 y.o. male.  Presents today from urgent care for abdominal pain, near syncopal episodes and nausea since 3 AM.  Patient denies fever, chills, cough, congestion, diarrhea, constipation, vomiting, chest pain, or shortness of breath.  Patient reports drinking approximately two 1 ounce/drinks per night.    Abdominal Pain Associated symptoms: nausea        Prior to Admission medications  Medication Sig Start Date End Date Taking? Authorizing Provider  aspirin  81 MG tablet Take 81 mg by mouth daily.    [provider]  fexofenadine (ALLEGRA) 180 MG tablet Take 180 mg by mouth daily.    [provider]  losartan -hydrochlorothiazide  (HYZAAR) 50-12.5 MG per tablet Take 1 tablet by mouth daily.    [provider]    Allergies: Triptans, Dulera [mometasone furo-formoterol fum], and Onion    Review of Systems  Gastrointestinal:  Positive for abdominal pain and nausea.    Updated Vital Signs BP 125/84   Pulse 72   Temp 98.7 F (37.1 C) (Oral)   Resp 11   SpO2 94%   Physical Exam Vitals and nursing note reviewed.  Constitutional:      General: He is not in acute distress.    Appearance: He is well-developed. He is not toxic-appearing.     Comments: Uncomfortable appearing  HENT:     Head: Normocephalic and atraumatic.  Eyes:     Conjunctiva/sclera: Conjunctivae normal.  Cardiovascular:     Rate and Rhythm: Normal rate and regular rhythm.     Heart sounds: Normal heart sounds. No murmur heard. Pulmonary:     Effort: Pulmonary effort is normal. No respiratory distress.     Breath sounds: Normal breath sounds.  Abdominal:     General: There is no distension.     Palpations: Abdomen is soft.     Tenderness: There is abdominal tenderness in the epigastric area.  There is guarding.  Musculoskeletal:        General: No swelling.     Cervical back: Neck supple.  Skin:    General: Skin is warm and dry.     Capillary Refill: Capillary refill takes less than 2 seconds.  Neurological:     General: No focal deficit present.     Mental Status: He is alert and oriented to person, place, and time.  Psychiatric:        Mood and Affect: Mood normal.     (all labs ordered are listed, but only abnormal results are displayed) Labs Reviewed  LIPASE, BLOOD - Abnormal; Notable for the following components:      Result Value   Lipase >2,800 (*)    All other components within normal limits  COMPREHENSIVE METABOLIC PANEL WITH GFR - Abnormal; Notable for the following components:   Glucose, Bld 124 (*)    All other components within normal limits  CBC - Abnormal; Notable for the following components:   WBC 14.0 (*)    Hemoglobin 18.1 (*)    All other components within normal limits  URINALYSIS, ROUTINE W REFLEX MICROSCOPIC - Abnormal; Notable for the following components:   Specific Gravity, Urine >1.046 (*)    Hgb urine dipstick SMALL (*)    Ketones, ur 5 (*)    All other components within normal limits  TRIGLYCERIDES  HIV ANTIBODY (ROUTINE TESTING W REFLEX)  CBC  CREATININE, SERUM  COMPREHENSIVE METABOLIC PANEL WITH GFR  CBC  I-STAT CG4 LACTIC ACID, ED    EKG: None  Radiology: CT ABDOMEN PELVIS W CONTRAST Result Date: 04/14/2024 EXAM: CT ABDOMEN AND PELVIS WITH CONTRAST 04/14/2024 03:26:00 PM TECHNIQUE: CT of the abdomen and pelvis was performed with the administration of 100 mL of iohexol  (OMNIPAQUE ) 300 MG/ML solution. Multiplanar reformatted images are provided for review. Automated exposure control, iterative reconstruction, and/or weight-based adjustment of the mA/kV was utilized to reduce the radiation dose to as low as reasonably achievable. COMPARISON: 01/03/2020 CLINICAL HISTORY: Abdominal pain, acute FINDINGS: LOWER CHEST: No acute  abnormality. LIVER: The liver is unremarkable. GALLBLADDER AND BILE DUCTS: Gallbladder is unremarkable. No biliary ductal dilatation. SPLEEN: No acute abnormality. PANCREAS: Diffuse peripancreatic inflammatory stranding and free fluid. No loculated pancreatic fluid collections. Homogeneous enhancement of the pancreas. No pancreatic ductal dilation. ADRENAL GLANDS: No acute abnormality. KIDNEYS, URETERS AND BLADDER: 2.1 cm left upper pole renal cyst. No stones in the kidneys or ureters. No hydronephrosis. No perinephric or periureteral stranding. Urinary bladder is unremarkable. GI AND BOWEL: Stomach demonstrates no acute abnormality. Colonic diverticulosis. There is no bowel obstruction. PERITONEUM AND RETROPERITONEUM: Trace volume retroperitoneal free fluid. No free air. VASCULATURE: Aorta is normal in caliber. LYMPH NODES: No lymphadenopathy. REPRODUCTIVE ORGANS: No acute abnormality. BONES AND SOFT TISSUES: No acute osseous abnormality. No focal soft tissue abnormality. IMPRESSION: 1. Acute pancreatitis without evidence of necrosis or loculated peripancreatic fluid collection. Electronically signed by: Michaeline Blanch MD 04/14/2024 03:32 PM EST RP Workstation: [id number]     Procedures   Medications Ordered in the ED  enoxaparin  (LOVENOX ) injection 40 mg (has no administration in time range)  0.9 %  sodium chloride  infusion (has no administration in time range)  acetaminophen  (TYLENOL ) tablet 650 mg (has no administration in time range)    Or  acetaminophen  (TYLENOL ) suppository 650 mg (has no administration in time range)  oxyCODONE  (Oxy IR/ROXICODONE ) immediate release tablet 5 mg (has no administration in time range)  HYDROmorphone  (DILAUDID ) injection 0.5-1 mg (has no administration in time range)  ondansetron  (ZOFRAN ) tablet 4 mg (has no administration in time range)    Or  ondansetron  (ZOFRAN ) injection 4 mg (has no administration in time range)  iohexol  (OMNIPAQUE ) 300 MG/ML solution 100 mL  (100 mLs Intravenous Contrast Given 04/14/24 1520)  0.9 %  sodium chloride  infusion ( Intravenous New Bag/Given 04/14/24 1609)  sodium chloride  0.9 % bolus 1,000 mL (1,000 mLs Intravenous New Bag/Given 04/14/24 1609)  ondansetron  (ZOFRAN ) injection 4 mg (4 mg Intravenous Given 04/14/24 1606)  morphine  (PF) 4 MG/ML injection 4 mg (4 mg Intravenous Given 04/14/24 1606)  HYDROmorphone  (DILAUDID ) injection 1 mg (1 mg Intravenous Given 04/14/24 1717)                                    Medical Decision Making Amount and/or Complexity of Data Reviewed Labs: ordered.  Risk Prescription drug management.   This patient presents to the ED for concern of epigastric abdominal pain differential diagnosis includes GERD, pancreatitis, cholelithiasis, acute cholecystitis, appendicitis, SBO, diverticulitis    Additional history obtained   Additional history obtained from Electronic Medical Record External records from outside source obtained and reviewed including Care Everywhere   Lab Tests:  I Ordered, and personally interpreted labs.  The pertinent results include: Lipase greater than 2800, CMP unremarkable,  leukocytosis of 14.0, lactic acid 0.8, UA with small hemoglobin, 5 ketones, elevated specific gravity EKG showing sinus rhythm, prolonged PR interval   Imaging Studies ordered:  I ordered imaging studies including CT abdomen pelvis with contrast I independently visualized and interpreted imaging which showed acute pancreatitis without evidence of necrosis or loculated peripancreatic fluid collection. I agree with the radiologist interpretation   Medicines ordered and prescription drug management:  I ordered medication including IVF, morphine , Zofran , Dilaudid     I have reviewed the patients home medicines and have made adjustments as needed   Problem List / ED Course:  Consulted hospitalist, Dr. Vernon he is agreeable to admission for acute pancreatitis.     Final  diagnoses:  Acute pancreatitis without infection or necrosis, unspecified pancreatitis type    ED Discharge Orders     None          Francis Ileana SAILOR, PA-C 04/14/24 1717    Francesca Elsie CROME, MD 04/14/24 2351  "

## 2024-04-14 NOTE — H&P (Signed)
 " History and Physical    AARONJAMES Cameron FMW:[id number] DOB: 1961/02/25 DOA: 04/14/2024  PCP: Delice Lamar, MD (Inactive)  Patient coming from: Home  I have personally briefly reviewed patient's old medical records in [facility]  Chief Complaint: Abdominal pain  HPI: Luis Cameron is a 63 y.o. male with medical history significant of obesity, hypertension, alcohol use, hyperlipidemia presented with severe abdominal pain started 330 this morning.    Patient reports that he was sleeping and he woke up with severe abdominal pain.  He took 2 ibuprofen  at 3:30 AM, he felt dizzy and had cold sweats and nauseated.  Pain did not improve therefore he took 2 ibuprofen  again around 930.  Due to persistent pain he went to urgent care and was recommended to go to ER for further evaluation and management.  No fever, chills, vomiting, poor appetite, generalized weakness, lethargy, diarrhea, constipation, melena or unintentional weight loss.  Reports no history of tobacco abuse, illicit drug use however drinks alcohol (couple of scotch per night).  Denies prior history of pancreatitis.  ED Course: Upon arrival: Patient afebrile, RR: 18, BP: 137/94, maintaining oxygen saturation on room air.  CBC shows leukocytosis of 14,000, H&H 18.1/52.0, PLT: 264.  NA: 140, K: 3.6, BG: 124, normal liver enzyme and kidney function.  Lipase: >2800.  CT abdomen/pelvis concerning for acute pancreatitis.  Was given IV fluids in the ER and morphine  along with Zofran .  Prior hospitalist consulted for admission for acute pancreatitis.  Review of Systems: As per HPI otherwise negative.    Past Medical History:  Diagnosis Date   Acute meniscal tear of right knee    Allergic rhinitis    Colon polyps    04/2012--71mm cecal p: sessile serrated adenoma - repeat colonoscopy 2019  Dr Donnald   Dyslipidemia    H/O prostatitis    HTN (hypertension)    Migraine    Due to onion exposure   Obesity     History reviewed. No  pertinent surgical history.   reports that he has never smoked. He does not have any smokeless tobacco history on file. He reports current alcohol use of about 2.0 standard drinks of alcohol per week. He reports that he does not use drugs.  Allergies[1]  Family History  Problem Relation Age of Onset   CAD Mother    Stroke Mother    Hypertension Mother    Hypertension Father    CAD Father     Prior to Admission medications  Medication Sig Start Date End Date Taking? Authorizing Provider  aspirin  81 MG tablet Take 81 mg by mouth daily.    [provider]  fexofenadine (ALLEGRA) 180 MG tablet Take 180 mg by mouth daily.    [provider]  losartan -hydrochlorothiazide  (HYZAAR) 50-12.5 MG per tablet Take 1 tablet by mouth daily.    [provider]    Physical Exam: Vitals:   04/14/24 1325 04/14/24 1601 04/14/24 1615  BP: (!) 137/94  125/84  Pulse: 74  72  Resp: 18  11  Temp: 97.9 F (36.6 C) 98.7 F (37.1 C)   TempSrc: Oral Oral   SpO2: 92%  94%    Constitutional: NAD, calm, comfortable, on room air, communicating well, wife at the bedside Eyes: PERRL, lids and conjunctivae normal ENMT: Mucous membranes are moist. Posterior pharynx clear of any exudate or lesions.Normal dentition.  Neck: normal, supple, no masses, no thyromegaly Respiratory: clear to auscultation bilaterally, no wheezing, no crackles. Normal respiratory effort.  No accessory muscle use.  Cardiovascular: Regular rate and rhythm, no murmurs / rubs / gallops. No extremity edema. 2+ pedal pulses. No carotid bruits.  Abdomen: Abdomen soft, generalized abdominal tenderness positive, no guarding, no rigidity, no hepatosplenomegaly.  Bowel sounds positive Musculoskeletal: no clubbing / cyanosis. No joint deformity upper and lower extremities. Good ROM, no contractures. Normal muscle tone.  Skin: no rashes, lesions, ulcers. No induration Neurologic: CN 2-12 grossly intact. Sensation intact,  DTR normal. Strength 5/5 in all 4.  Psychiatric: Normal judgment and insight. Alert and oriented x 3. Normal mood.    Labs on Admission: I have personally reviewed following labs and imaging studies  CBC: Recent Labs  Lab 04/14/24 1339  WBC 14.0*  HGB 18.1*  HCT 52.0  MCV 92.9  PLT 264   Basic Metabolic Panel: Recent Labs  Lab 04/14/24 1339  NA 140  K 3.6  CL 104  CO2 26  GLUCOSE 124*  BUN 21  CREATININE 0.83  CALCIUM 9.9   GFR: CrCl cannot be calculated (Unknown ideal weight.). Liver Function Tests: Recent Labs  Lab 04/14/24 1339  AST 24  ALT 22  ALKPHOS 96  BILITOT 1.0  PROT 7.3  ALBUMIN 4.2   Recent Labs  Lab 04/14/24 1339  LIPASE >2,800*   No results for input(s): AMMONIA in the last 168 hours. Coagulation Profile: No results for input(s): INR, PROTIME in the last 168 hours. Cardiac Enzymes: No results for input(s): CKTOTAL, CKMB, CKMBINDEX, TROPONINI in the last 168 hours. BNP (last 3 results) No results for input(s): PROBNP in the last 8760 hours. HbA1C: No results for input(s): HGBA1C in the last 72 hours. CBG: No results for input(s): GLUCAP in the last 168 hours. Lipid Profile: No results for input(s): CHOL, HDL, LDLCALC, TRIG, CHOLHDL, LDLDIRECT in the last 72 hours. Thyroid Function Tests: No results for input(s): TSH, T4TOTAL, FREET4, T3FREE, THYROIDAB in the last 72 hours. Anemia Panel: No results for input(s): VITAMINB12, FOLATE, FERRITIN, TIBC, IRON, RETICCTPCT in the last 72 hours. Urine analysis:    Component Value Date/Time   COLORURINE YELLOW 04/14/2024 1624   APPEARANCEUR CLEAR 04/14/2024 1624   LABSPEC >1.046 (H) 04/14/2024 1624   PHURINE 5.0 04/14/2024 1624   GLUCOSEU NEGATIVE 04/14/2024 1624   HGBUR SMALL (A) 04/14/2024 1624   BILIRUBINUR NEGATIVE 04/14/2024 1624   KETONESUR 5 (A) 04/14/2024 1624   PROTEINUR NEGATIVE 04/14/2024 1624   NITRITE NEGATIVE 04/14/2024  1624   LEUKOCYTESUR NEGATIVE 04/14/2024 1624    Radiological Exams on Admission: CT ABDOMEN PELVIS W CONTRAST Result Date: 04/14/2024 EXAM: CT ABDOMEN AND PELVIS WITH CONTRAST 04/14/2024 03:26:00 PM TECHNIQUE: CT of the abdomen and pelvis was performed with the administration of 100 mL of iohexol  (OMNIPAQUE ) 300 MG/ML solution. Multiplanar reformatted images are provided for review. Automated exposure control, iterative reconstruction, and/or weight-based adjustment of the mA/kV was utilized to reduce the radiation dose to as low as reasonably achievable. COMPARISON: 01/03/2020 CLINICAL HISTORY: Abdominal pain, acute FINDINGS: LOWER CHEST: No acute abnormality. LIVER: The liver is unremarkable. GALLBLADDER AND BILE DUCTS: Gallbladder is unremarkable. No biliary ductal dilatation. SPLEEN: No acute abnormality. PANCREAS: Diffuse peripancreatic inflammatory stranding and free fluid. No loculated pancreatic fluid collections. Homogeneous enhancement of the pancreas. No pancreatic ductal dilation. ADRENAL GLANDS: No acute abnormality. KIDNEYS, URETERS AND BLADDER: 2.1 cm left upper pole renal cyst. No stones in the kidneys or ureters. No hydronephrosis. No perinephric or periureteral stranding. Urinary bladder is unremarkable. GI AND BOWEL: Stomach demonstrates no acute abnormality. Colonic diverticulosis.  There is no bowel obstruction. PERITONEUM AND RETROPERITONEUM: Trace volume retroperitoneal free fluid. No free air. VASCULATURE: Aorta is normal in caliber. LYMPH NODES: No lymphadenopathy. REPRODUCTIVE ORGANS: No acute abnormality. BONES AND SOFT TISSUES: No acute osseous abnormality. No focal soft tissue abnormality. IMPRESSION: 1. Acute pancreatitis without evidence of necrosis or loculated peripancreatic fluid collection. Electronically signed by: Michaeline Blanch MD 04/14/2024 03:32 PM EST RP Workstation: [id number]    EKG: Independently reviewed.  Sinus rhythm.  Prolonged PR interval.  Left axis deviation.   No acute ST-T wave changes noted.  Assessment/Plan  Acute pancreatitis: - Patient presented with severe abdominal pain.  History of alcohol use. - Will admit patient on the floor.  He is afebrile with leukocytosis of 14,000.  Lipase significantly elevated.  Reviewed CT abdomen/pelvis concerning for acute pancreatitis without evidence of necrosis or loculated peripancreatic fluid collection. -Triglycerides pending. - Start IV fluids, Zofran  as needed, continue as needed pain medication.  Will keep him NPO.  Hypertension: - On losartan -HCTZ at home.  Will hold off for now since patient is n.p.o. - Hydralazine  IV as needed for BP above 160.  Monitor BP at home  Hyperlipidemia: On atorvastatin.  Hold for now  Obesity: Diet modification, exercise and weight loss recommended  Alcohol use: Check ethanol level. - Recommend cessation. -CIWA protocol  DVT prophylaxis: Lovenox  Code Status: Full code Family Communication: Patient's wife present at bedside.  Plan of care discussed with patient in length and he verbalized understanding and agreed with it. Disposition Plan: Home in 2-3 days Consults called: None Admission status: Inpatient   Velna JONELLE Skeeter MD [facility]  If 7PM-7AM, please contact night-coverage [email or web address]  04/14/2024, 5:30 PM        [1]  Allergies Allergen Reactions   Triptans Anaphylaxis    Ineffective   Dulera [Mometasone Furo-Formoterol Fum] Cough   Onion     Migraines   "

## 2024-04-14 NOTE — ED Notes (Signed)
 Patient transported to CT

## 2024-04-14 NOTE — ED Triage Notes (Signed)
 Pt sent from UC for mid abdominal pain and nausea since 3 am

## 2024-04-15 DIAGNOSIS — K852 Alcohol induced acute pancreatitis without necrosis or infection: Secondary | ICD-10-CM | POA: Diagnosis not present

## 2024-04-15 DIAGNOSIS — E785 Hyperlipidemia, unspecified: Secondary | ICD-10-CM | POA: Insufficient documentation

## 2024-04-15 DIAGNOSIS — I10 Essential (primary) hypertension: Secondary | ICD-10-CM | POA: Insufficient documentation

## 2024-04-15 LAB — COMPREHENSIVE METABOLIC PANEL WITH GFR
ALT: 15 U/L (ref 0–44)
AST: 17 U/L (ref 15–41)
Albumin: 3.7 g/dL (ref 3.5–5.0)
Alkaline Phosphatase: 85 U/L (ref 38–126)
Anion gap: 9 (ref 5–15)
BUN: 17 mg/dL (ref 8–23)
CO2: 27 mmol/L (ref 22–32)
Calcium: 9 mg/dL (ref 8.9–10.3)
Chloride: 104 mmol/L (ref 98–111)
Creatinine, Ser: 0.7 mg/dL (ref 0.61–1.24)
GFR, Estimated: >60 mL/min
Glucose, Bld: 103 mg/dL — ABNORMAL HIGH (ref 70–99)
Potassium: 3.8 mmol/L (ref 3.5–5.1)
Sodium: 139 mmol/L (ref 135–145)
Total Bilirubin: 0.9 mg/dL (ref 0.0–1.2)
Total Protein: 6.6 g/dL (ref 6.5–8.1)

## 2024-04-15 LAB — CBC
HCT: 46.4 % (ref 39.0–52.0)
Hemoglobin: 15.8 g/dL (ref 13.0–17.0)
MCH: 31.9 pg (ref 26.0–34.0)
MCHC: 34.1 g/dL (ref 30.0–36.0)
MCV: 93.5 fL (ref 80.0–100.0)
Platelets: 223 K/uL (ref 150–400)
RBC: 4.96 MIL/uL (ref 4.22–5.81)
RDW: 13 % (ref 11.5–15.5)
WBC: 17.8 K/uL — ABNORMAL HIGH (ref 4.0–10.5)
nRBC: 0 % (ref 0.0–0.2)

## 2024-04-15 MED ORDER — SODIUM CHLORIDE 0.9 % IV SOLN: INTRAVENOUS | Status: DC

## 2024-04-15 MED ORDER — MORPHINE SULFATE (PF) 2 MG/ML IV SOLN
2.0000 mg | INTRAVENOUS | Status: DC | PRN
  Administered 2024-04-15 (×5): 2 mg via INTRAVENOUS
  Filled 2024-04-15 (×5): qty 1

## 2024-04-15 NOTE — Progress Notes (Signed)
 Received verbal order from Dr Rojelio to resume IVF's.

## 2024-04-15 NOTE — Progress Notes (Signed)
 Pt requesting additional pain medication. Shared with him the current medications ordered for pain. He stated that he doesn't like the way Dilaudid  makes him feel. He doesn't want to take it. Oxycodone  was given to pt earlier tonight. It isn't time for this to be administered again. Shared with him that the nurse will reach out to the provider to see if they will order a different IV pain mediation for him. Pt stated okay.

## 2024-04-15 NOTE — Progress Notes (Signed)
 Lavanda Horns, NP was notified of information from note at 0020. New orders received.

## 2024-04-15 NOTE — Progress Notes (Signed)
 " PROGRESS NOTE    Luis Cameron  FMW:[id number] DOB: October 06, 1960 DOA: 04/14/2024 PCP: Delice Lamar, MD (Inactive)     Brief Narrative:  Luis Cameron is a 63 y.o. male with medical history significant of obesity, hypertension, alcohol use, hyperlipidemia presented with severe abdominal pain started 330 this morning.  Patient reports that he was sleeping and he woke up with severe abdominal pain.  He took 2 ibuprofen  at 3:30 AM, he felt dizzy and had cold sweats and nauseated.  Pain did not improve therefore he took 2 ibuprofen  again around 930.  Due to persistent pain he went to urgent care and was recommended to go to ER for further evaluation and management.  Work up showed lipase: >2800, CT abdomen/pelvis concerning for acute pancreatitis.  Was given IV fluids in the ER and morphine  along with Zofran .  Prior hospitalist consulted for admission for acute pancreatitis.   New events last 24 hours / Subjective: Patient states that his epigastric abdominal pain is mildly improved since admission.  It is at a 7 out of 10 instead of 10 out of 10.  Dilaudid  gave him some side effects, and was switched to morphine  overnight.  Admits to hunger, when his stomach growls he notices radiating abdominal pain.  Had some nausea and vomiting with morphine  administration.  Assessment & Plan:   Principal Problem:   Acute pancreatitis Active Problems:   HTN (hypertension)   HLD (hyperlipidemia)   Acute pancreatitis - History of alcohol use - Continue supportive care with IV fluid, pain medication - Advance to clear liquid diet today  Hypertension - Losartan -HCTZ at home  Hyperlipidemia - Atorvastatin at home   DVT prophylaxis:  enoxaparin  (LOVENOX ) injection 40 mg Start: 04/14/24 2200 SCDs Start: 04/14/24 1715  Code Status: Full code Family Communication: Spouse at bedside Disposition Plan: Home Status is: Inpatient Remains inpatient appropriate because: IV fluid    Antimicrobials:   Anti-infectives (From admission, onward)    None        Objective: Vitals:   04/14/24 2120 04/15/24 0017 04/15/24 0514 04/15/24 0941  BP: (!) 139/92 (!) 140/86 128/79 124/79  Pulse: 75 76 72 69  Resp: 16 16 14    Temp: (!) 97.4 F (36.3 C) 98.4 F (36.9 C) 98.1 F (36.7 C) (!) 97.4 F (36.3 C)  TempSrc: Oral Oral Oral Oral  SpO2: 99% 96% 97% 97%    Intake/Output Summary (Last 24 hours) at 04/15/2024 1241 Last data filed at 04/15/2024 1000 Gross per 24 hour  Intake 2787.13 ml  Output 25 ml  Net 2762.13 ml   There were no vitals filed for this visit.  Examination:  General exam: Appears calm and comfortable  Respiratory system: Clear to auscultation. Respiratory effort normal. No respiratory distress. No conversational dyspnea.  Cardiovascular system: S1 & S2 heard, RRR. No murmurs. No pedal edema. Gastrointestinal system: Abdomen is nondistended, soft and TTP epigastrium  Central nervous system: Alert and oriented. No focal neurological deficits. Speech clear.  Extremities: Symmetric in appearance  Skin: No rashes, lesions or ulcers on exposed skin  Psychiatry: Judgement and insight appear normal. Mood & affect appropriate.   Data Reviewed: I have personally reviewed following labs and imaging studies  CBC: Recent Labs  Lab 04/14/24 1339 04/14/24 1815 04/15/24 0501  WBC 14.0* 16.1* 17.8*  HGB 18.1* 16.6 15.8  HCT 52.0 47.6 46.4  MCV 92.9 92.1 93.5  PLT 264 249 223   Basic Metabolic Panel: Recent Labs  Lab 04/14/24 1339 04/14/24 1815  04/15/24 0501  NA 140  --  139  K 3.6  --  3.8  CL 104  --  104  CO2 26  --  27  GLUCOSE 124*  --  103*  BUN 21  --  17  CREATININE 0.83 0.63 0.70  CALCIUM 9.9  --  9.0   GFR: CrCl cannot be calculated (Unknown ideal weight.). Liver Function Tests: Recent Labs  Lab 04/14/24 1339 04/15/24 0501  AST 24 17  ALT 22 15  ALKPHOS 96 85  BILITOT 1.0 0.9  PROT 7.3 6.6  ALBUMIN 4.2 3.7   Recent Labs  Lab  04/14/24 1339  LIPASE >2,800*   No results for input(s): AMMONIA in the last 168 hours. Coagulation Profile: No results for input(s): INR, PROTIME in the last 168 hours. Cardiac Enzymes: No results for input(s): CKTOTAL, CKMB, CKMBINDEX, TROPONINI in the last 168 hours. BNP (last 3 results) No results for input(s): PROBNP in the last 8760 hours. HbA1C: No results for input(s): HGBA1C in the last 72 hours. CBG: No results for input(s): GLUCAP in the last 168 hours. Lipid Profile: Recent Labs    04/14/24 1815  TRIG 49   Thyroid Function Tests: No results for input(s): TSH, T4TOTAL, FREET4, T3FREE, THYROIDAB in the last 72 hours. Anemia Panel: No results for input(s): VITAMINB12, FOLATE, FERRITIN, TIBC, IRON, RETICCTPCT in the last 72 hours. Sepsis Labs: Recent Labs  Lab 04/14/24 1619 04/14/24 1815  LATICACIDVEN 0.8 1.8    No results found for this or any previous visit (from the past 240 hours).    Radiology Studies: CT ABDOMEN PELVIS W CONTRAST Result Date: 04/14/2024 EXAM: CT ABDOMEN AND PELVIS WITH CONTRAST 04/14/2024 03:26:00 PM TECHNIQUE: CT of the abdomen and pelvis was performed with the administration of 100 mL of iohexol  (OMNIPAQUE ) 300 MG/ML solution. Multiplanar reformatted images are provided for review. Automated exposure control, iterative reconstruction, and/or weight-based adjustment of the mA/kV was utilized to reduce the radiation dose to as low as reasonably achievable. COMPARISON: 01/03/2020 CLINICAL HISTORY: Abdominal pain, acute FINDINGS: LOWER CHEST: No acute abnormality. LIVER: The liver is unremarkable. GALLBLADDER AND BILE DUCTS: Gallbladder is unremarkable. No biliary ductal dilatation. SPLEEN: No acute abnormality. PANCREAS: Diffuse peripancreatic inflammatory stranding and free fluid. No loculated pancreatic fluid collections. Homogeneous enhancement of the pancreas. No pancreatic ductal dilation. ADRENAL  GLANDS: No acute abnormality. KIDNEYS, URETERS AND BLADDER: 2.1 cm left upper pole renal cyst. No stones in the kidneys or ureters. No hydronephrosis. No perinephric or periureteral stranding. Urinary bladder is unremarkable. GI AND BOWEL: Stomach demonstrates no acute abnormality. Colonic diverticulosis. There is no bowel obstruction. PERITONEUM AND RETROPERITONEUM: Trace volume retroperitoneal free fluid. No free air. VASCULATURE: Aorta is normal in caliber. LYMPH NODES: No lymphadenopathy. REPRODUCTIVE ORGANS: No acute abnormality. BONES AND SOFT TISSUES: No acute osseous abnormality. No focal soft tissue abnormality. IMPRESSION: 1. Acute pancreatitis without evidence of necrosis or loculated peripancreatic fluid collection. Electronically signed by: Michaeline Blanch MD 04/14/2024 03:32 PM EST RP Workstation: [id number]      Scheduled Meds:  enoxaparin  (LOVENOX ) injection  40 mg Subcutaneous Q24H   pneumococcal 20-valent conjugate vaccine  0.5 mL Intramuscular Tomorrow-1000   Continuous Infusions:  sodium chloride  125 mL/hr at 04/15/24 1109     LOS: 1 day   Time spent: 25 minutes   Delon Hoe, DO [facility] 04/15/2024, 12:41 PM   Available via Epic secure chat 7am-7pm After these hours, please refer to coverage provider listed on [email or web address]  "

## 2024-04-15 NOTE — Plan of Care (Signed)
  Problem: Clinical Measurements: Goal: Ability to maintain clinical measurements within normal limits will improve Outcome: Progressing   Problem: Activity: Goal: Risk for activity intolerance will decrease Outcome: Progressing   Problem: Coping: Goal: Level of anxiety will decrease Outcome: Progressing   Problem: Pain Managment: Goal: General experience of comfort will improve and/or be controlled Outcome: Progressing   Problem: Safety: Goal: Ability to remain free from injury will improve Outcome: Progressing

## 2024-04-16 DIAGNOSIS — K852 Alcohol induced acute pancreatitis without necrosis or infection: Secondary | ICD-10-CM | POA: Diagnosis not present

## 2024-04-16 LAB — HIV ANTIBODY (ROUTINE TESTING W REFLEX): HIV Screen 4th Generation wRfx: NONREACTIVE

## 2024-04-16 LAB — CBC
HCT: 44.1 % (ref 39.0–52.0)
Hemoglobin: 15.1 g/dL (ref 13.0–17.0)
MCH: 31.8 pg (ref 26.0–34.0)
MCHC: 34.2 g/dL (ref 30.0–36.0)
MCV: 92.8 fL (ref 80.0–100.0)
Platelets: 192 K/uL (ref 150–400)
RBC: 4.75 MIL/uL (ref 4.22–5.81)
RDW: 13.2 % (ref 11.5–15.5)
WBC: 15.3 K/uL — ABNORMAL HIGH (ref 4.0–10.5)
nRBC: 0 % (ref 0.0–0.2)

## 2024-04-16 MED ORDER — LOSARTAN POTASSIUM-HCTZ 50-12.5 MG PO TABS: 1.0000 | ORAL_TABLET | Freq: Every day | ORAL | Status: DC

## 2024-04-16 MED ORDER — ASPIRIN 81 MG PO TBEC
81.0000 mg | DELAYED_RELEASE_TABLET | Freq: Every day | ORAL | Status: DC
  Administered 2024-04-16 – 2024-04-17 (×2): 81 mg via ORAL
  Filled 2024-04-16 (×2): qty 1

## 2024-04-16 MED ORDER — HYDROCHLOROTHIAZIDE 12.5 MG PO TABS
12.5000 mg | ORAL_TABLET | Freq: Every day | ORAL | Status: DC
  Administered 2024-04-16: 12.5 mg via ORAL
  Filled 2024-04-16: qty 1

## 2024-04-16 MED ORDER — IBUPROFEN 400 MG PO TABS
600.0000 mg | ORAL_TABLET | Freq: Four times a day (QID) | ORAL | Status: DC | PRN
  Administered 2024-04-16 – 2024-04-17 (×2): 600 mg via ORAL
  Filled 2024-04-16 (×2): qty 1

## 2024-04-16 MED ORDER — AMLODIPINE BESYLATE 5 MG PO TABS
5.0000 mg | ORAL_TABLET | Freq: Every day | ORAL | Status: DC
  Administered 2024-04-16 – 2024-04-17 (×2): 5 mg via ORAL
  Filled 2024-04-16 (×2): qty 1

## 2024-04-16 MED ORDER — LOSARTAN POTASSIUM 50 MG PO TABS
50.0000 mg | ORAL_TABLET | Freq: Every day | ORAL | Status: DC
  Administered 2024-04-16 – 2024-04-17 (×2): 50 mg via ORAL
  Filled 2024-04-16 (×2): qty 1

## 2024-04-16 MED ORDER — SODIUM CHLORIDE 0.9 % IV SOLN: INTRAVENOUS | Status: DC

## 2024-04-16 NOTE — Progress Notes (Signed)
 " PROGRESS NOTE    Luis Cameron  FMW:[id number] DOB: 27-Oct-1960 DOA: 04/14/2024 PCP: Delice Lamar, MD (Inactive)     Brief Narrative:  Luis Cameron is a 63 y.o. male with medical history significant of obesity, hypertension, alcohol use, hyperlipidemia presented with severe abdominal pain started 330 this morning.  Patient reports that he was sleeping and he woke up with severe abdominal pain.  He took 2 ibuprofen  at 3:30 AM, he felt dizzy and had cold sweats and nauseated.  Pain did not improve therefore he took 2 ibuprofen  again around 930.  Due to persistent pain he went to urgent care and was recommended to go to ER for further evaluation and management.  Work up showed lipase: >2800, CT abdomen/pelvis concerning for acute pancreatitis.  Was given IV fluids in the ER and morphine  along with Zofran .  Prior hospitalist consulted for admission for acute pancreatitis.   New events last 24 hours / Subjective: Abdominal pain continues to improve, now pain level is about a 5 out of 10, was at a 7 out of 10 yesterday.  Has been tolerating clear liquid diet, ice pops and juice without any issue.  Advancing to full liquid today.  Assessment & Plan:   Principal Problem:   Acute pancreatitis Active Problems:   HTN (hypertension)   HLD (hyperlipidemia)   Acute pancreatitis - History of alcohol use - Continue supportive care with IV fluid, pain medication - Advance to full liquid diet today  Hypertension - Losartan -HCTZ, Norvasc    DVT prophylaxis:  enoxaparin  (LOVENOX ) injection 40 mg Start: 04/14/24 2200 SCDs Start: 04/14/24 1715  Code Status: Full code Family Communication: None at bedside Disposition Plan: Home Status is: Inpatient Remains inpatient appropriate because: IV fluid, advance diet today, resume home meds, hopeful discharge home 12/23    Antimicrobials:  Anti-infectives (From admission, onward)    None        Objective: Vitals:   04/15/24 0941 04/15/24  1343 04/15/24 2041 04/16/24 0603  BP: 124/79 132/82 135/83 (!) 140/81  Pulse: 69 74 88 68  Resp:   17 17  Temp: (!) 97.4 F (36.3 C) 98.4 F (36.9 C) 100 F (37.8 C) 98 F (36.7 C)  TempSrc: Oral Oral  Oral  SpO2: 97% 97% 95% 95%    Intake/Output Summary (Last 24 hours) at 04/16/2024 1207 Last data filed at 04/16/2024 1000 Gross per 24 hour  Intake 2775.33 ml  Output --  Net 2775.33 ml   There were no vitals filed for this visit.  Examination:  General exam: Appears calm and comfortable  Respiratory system: Clear to auscultation. Respiratory effort normal. No respiratory distress. No conversational dyspnea.  Cardiovascular system: S1 & S2 heard, RRR. No murmurs. No pedal edema. Gastrointestinal system: Abdomen is nondistended, soft and not much tenderness to palpation today Central nervous system: Alert and oriented. No focal neurological deficits. Speech clear.  Extremities: Symmetric in appearance  Skin: No rashes, lesions or ulcers on exposed skin  Psychiatry: Judgement and insight appear normal. Mood & affect appropriate.   Data Reviewed: I have personally reviewed following labs and imaging studies  CBC: Recent Labs  Lab 04/14/24 1339 04/14/24 1815 04/15/24 0501 04/16/24 0407  WBC 14.0* 16.1* 17.8* 15.3*  HGB 18.1* 16.6 15.8 15.1  HCT 52.0 47.6 46.4 44.1  MCV 92.9 92.1 93.5 92.8  PLT 264 249 223 192   Basic Metabolic Panel: Recent Labs  Lab 04/14/24 1339 04/14/24 1815 04/15/24 0501  NA 140  --  139  K 3.6  --  3.8  CL 104  --  104  CO2 26  --  27  GLUCOSE 124*  --  103*  BUN 21  --  17  CREATININE 0.83 0.63 0.70  CALCIUM 9.9  --  9.0   GFR: CrCl cannot be calculated (Unknown ideal weight.). Liver Function Tests: Recent Labs  Lab 04/14/24 1339 04/15/24 0501  AST 24 17  ALT 22 15  ALKPHOS 96 85  BILITOT 1.0 0.9  PROT 7.3 6.6  ALBUMIN 4.2 3.7   Recent Labs  Lab 04/14/24 1339  LIPASE >2,800*   No results for input(s): AMMONIA in the  last 168 hours. Coagulation Profile: No results for input(s): INR, PROTIME in the last 168 hours. Cardiac Enzymes: No results for input(s): CKTOTAL, CKMB, CKMBINDEX, TROPONINI in the last 168 hours. BNP (last 3 results) No results for input(s): PROBNP in the last 8760 hours. HbA1C: No results for input(s): HGBA1C in the last 72 hours. CBG: No results for input(s): GLUCAP in the last 168 hours. Lipid Profile: Recent Labs    04/14/24 1815  TRIG 49   Thyroid Function Tests: No results for input(s): TSH, T4TOTAL, FREET4, T3FREE, THYROIDAB in the last 72 hours. Anemia Panel: No results for input(s): VITAMINB12, FOLATE, FERRITIN, TIBC, IRON, RETICCTPCT in the last 72 hours. Sepsis Labs: Recent Labs  Lab 04/14/24 1619 04/14/24 1815  LATICACIDVEN 0.8 1.8    No results found for this or any previous visit (from the past 240 hours).    Radiology Studies: CT ABDOMEN PELVIS W CONTRAST Result Date: 04/14/2024 EXAM: CT ABDOMEN AND PELVIS WITH CONTRAST 04/14/2024 03:26:00 PM TECHNIQUE: CT of the abdomen and pelvis was performed with the administration of 100 mL of iohexol  (OMNIPAQUE ) 300 MG/ML solution. Multiplanar reformatted images are provided for review. Automated exposure control, iterative reconstruction, and/or weight-based adjustment of the mA/kV was utilized to reduce the radiation dose to as low as reasonably achievable. COMPARISON: 01/03/2020 CLINICAL HISTORY: Abdominal pain, acute FINDINGS: LOWER CHEST: No acute abnormality. LIVER: The liver is unremarkable. GALLBLADDER AND BILE DUCTS: Gallbladder is unremarkable. No biliary ductal dilatation. SPLEEN: No acute abnormality. PANCREAS: Diffuse peripancreatic inflammatory stranding and free fluid. No loculated pancreatic fluid collections. Homogeneous enhancement of the pancreas. No pancreatic ductal dilation. ADRENAL GLANDS: No acute abnormality. KIDNEYS, URETERS AND BLADDER: 2.1 cm left upper pole  renal cyst. No stones in the kidneys or ureters. No hydronephrosis. No perinephric or periureteral stranding. Urinary bladder is unremarkable. GI AND BOWEL: Stomach demonstrates no acute abnormality. Colonic diverticulosis. There is no bowel obstruction. PERITONEUM AND RETROPERITONEUM: Trace volume retroperitoneal free fluid. No free air. VASCULATURE: Aorta is normal in caliber. LYMPH NODES: No lymphadenopathy. REPRODUCTIVE ORGANS: No acute abnormality. BONES AND SOFT TISSUES: No acute osseous abnormality. No focal soft tissue abnormality. IMPRESSION: 1. Acute pancreatitis without evidence of necrosis or loculated peripancreatic fluid collection. Electronically signed by: Michaeline Blanch MD 04/14/2024 03:32 PM EST RP Workstation: [id number]      Scheduled Meds:  enoxaparin  (LOVENOX ) injection  40 mg Subcutaneous Q24H   pneumococcal 20-valent conjugate vaccine  0.5 mL Intramuscular Tomorrow-1000   Continuous Infusions:  sodium chloride  125 mL/hr at 04/16/24 1146     LOS: 2 days   Time spent: 25 minutes   Delon Hoe, DO [facility] 04/16/2024, 12:07 PM   Available via Epic secure chat 7am-7pm After these hours, please refer to coverage provider listed on [email or web address]  "

## 2024-04-16 NOTE — Plan of Care (Signed)
   Problem: Education: Goal: Knowledge of General Education information will improve Description Including pain rating scale, medication(s)/side effects and non-pharmacologic comfort measures Outcome: Progressing

## 2024-04-16 NOTE — Plan of Care (Signed)
  Problem: Education: Goal: Knowledge of General Education information will improve Description: Including pain rating scale, medication(s)/side effects and non-pharmacologic comfort measures Outcome: Progressing   Problem: Pain Managment: Goal: General experience of comfort will improve and/or be controlled Outcome: Progressing   Problem: Activity: Goal: Risk for activity intolerance will decrease Outcome: Progressing

## 2024-04-17 ENCOUNTER — Other Ambulatory Visit (HOSPITAL_COMMUNITY): Payer: Self-pay

## 2024-04-17 LAB — CBC
HCT: 40.5 % (ref 39.0–52.0)
Hemoglobin: 14.4 g/dL (ref 13.0–17.0)
MCH: 32.7 pg (ref 26.0–34.0)
MCHC: 35.6 g/dL (ref 30.0–36.0)
MCV: 91.8 fL (ref 80.0–100.0)
Platelets: 191 K/uL (ref 150–400)
RBC: 4.41 MIL/uL (ref 4.22–5.81)
RDW: 12.7 % (ref 11.5–15.5)
WBC: 13.6 K/uL — ABNORMAL HIGH (ref 4.0–10.5)
nRBC: 0 % (ref 0.0–0.2)

## 2024-04-17 MED ORDER — LOSARTAN POTASSIUM 50 MG PO TABS
50.0000 mg | ORAL_TABLET | Freq: Every day | ORAL | 0 refills | Status: AC
  Filled 2024-04-17: qty 30, 30d supply, fill #0

## 2024-04-17 NOTE — Discharge Summary (Signed)
 Physician Discharge Summary  [name] FMW:[id number] DOB: [birth year] DOA: 04/14/2024  PCP: Delice Lamar, MD (Inactive)  Admit date: 04/14/2024 Discharge date: 04/17/2024 Discharging to: home     Discharge Diagnoses:   Principal Problem:   Acute pancreatitis Active Problems:   HTN (hypertension)   HLD (hyperlipidemia)     Brief Narrative:  Luis Cameron is a 63 y.o. male with medical history significant of obesity, hypertension, alcohol use, hyperlipidemia presented with severe abdominal pain started 330 this morning.  Patient reports that he was sleeping and he woke up with severe abdominal pain.  He took 2 ibuprofen  at 3:30 AM, he felt dizzy and had cold sweats and nauseated.  Pain did not improve therefore he took 2 ibuprofen  again around 930.  Due to persistent pain he went to urgent care and was recommended to go to ER for further evaluation and management.  Work up showed lipase: >2800, CT abdomen/pelvis concerning for acute pancreatitis.  Was given IV fluids in the ER and morphine  along with Zofran .  Prior hospitalist consulted for admission for acute pancreatitis.      Assessment & Plan: Acute pancreatitis - History of alcohol use but not much- hs 2 scotch a day - also has been on hydrochlorothiazide  which we will discontinue - Improved with supportive care   Hypertension - takes Losartan -HCTZ & Norvasc  - hold HCTZ          Discharge Instructions   Allergies as of 04/17/2024       Reactions   Triptans Anaphylaxis   Ineffective   Dulera [mometasone Furo-formoterol Fum] Cough   Onion    Migraines        Medication List     STOP taking these medications    losartan -hydrochlorothiazide  50-12.5 MG tablet Commonly known as: HYZAAR       TAKE these medications    amLODipine  5 MG tablet Commonly known as: NORVASC  Take 5 mg by mouth daily.   aspirin  81 MG tablet Take 81 mg by mouth daily.   Ergocalciferol 10 MCG (400 UNIT) Tabs Take 1  tablet by mouth daily.   fexofenadine 180 MG tablet Commonly known as: ALLEGRA Take 180 mg by mouth daily.   losartan  50 MG tablet Commonly known as: COZAAR  Take 1 tablet (50 mg total) by mouth daily.            The results of significant diagnostics from this hospitalization (including imaging, microbiology, ancillary and laboratory) are listed below for reference.    CT ABDOMEN PELVIS W CONTRAST Result Date: 04/14/2024 EXAM: CT ABDOMEN AND PELVIS WITH CONTRAST 04/14/2024 03:26:00 PM TECHNIQUE: CT of the abdomen and pelvis was performed with the administration of 100 mL of iohexol  (OMNIPAQUE ) 300 MG/ML solution. Multiplanar reformatted images are provided for review. Automated exposure control, iterative reconstruction, and/or weight-based adjustment of the mA/kV was utilized to reduce the radiation dose to as low as reasonably achievable. COMPARISON: 01/03/2020 CLINICAL HISTORY: Abdominal pain, acute FINDINGS: LOWER CHEST: No acute abnormality. LIVER: The liver is unremarkable. GALLBLADDER AND BILE DUCTS: Gallbladder is unremarkable. No biliary ductal dilatation. SPLEEN: No acute abnormality. PANCREAS: Diffuse peripancreatic inflammatory stranding and free fluid. No loculated pancreatic fluid collections. Homogeneous enhancement of the pancreas. No pancreatic ductal dilation. ADRENAL GLANDS: No acute abnormality. KIDNEYS, URETERS AND BLADDER: 2.1 cm left upper pole renal cyst. No stones in the kidneys or ureters. No hydronephrosis. No perinephric or periureteral stranding. Urinary bladder is unremarkable. GI AND BOWEL: Stomach demonstrates no acute abnormality. Colonic diverticulosis. There  is no bowel obstruction. PERITONEUM AND RETROPERITONEUM: Trace volume retroperitoneal free fluid. No free air. VASCULATURE: Aorta is normal in caliber. LYMPH NODES: No lymphadenopathy. REPRODUCTIVE ORGANS: No acute abnormality. BONES AND SOFT TISSUES: No acute osseous abnormality. No focal soft tissue  abnormality. IMPRESSION: 1. Acute pancreatitis without evidence of necrosis or loculated peripancreatic fluid collection. Electronically signed by: Michaeline Blanch MD 04/14/2024 03:32 PM EST RP Workstation: [id number]   Labs:   Basic Metabolic Panel: Recent Labs  Lab 04/14/24 1339 04/14/24 1815 04/15/24 0501  NA 140  --  139  K 3.6  --  3.8  CL 104  --  104  CO2 26  --  27  GLUCOSE 124*  --  103*  BUN 21  --  17  CREATININE 0.83 0.63 0.70  CALCIUM 9.9  --  9.0     CBC: Recent Labs  Lab 04/14/24 1339 04/14/24 1815 04/15/24 0501 04/16/24 0407 04/17/24 0454  WBC 14.0* 16.1* 17.8* 15.3* 13.6*  HGB 18.1* 16.6 15.8 15.1 14.4  HCT 52.0 47.6 46.4 44.1 40.5  MCV 92.9 92.1 93.5 92.8 91.8  PLT 264 249 223 192 191         SIGNED:   True Atlas, MD  [facility] 04/17/2024, 9:14 AM Time taking on discharge: 50 minutes

## 2024-04-17 NOTE — Progress Notes (Signed)
 Discharge medication delivered to patient at the bedside
